# Patient Record
Sex: Female | Born: 1935 | Race: Black or African American | Hispanic: No | Marital: Married | Smoking: Never smoker
Health system: Southern US, Community
[De-identification: ages and names within clinical notes are randomized; demographics above are authoritative.]

## PROBLEM LIST (undated history)

## (undated) DIAGNOSIS — G309 Alzheimer's disease, unspecified: Secondary | ICD-10-CM

## (undated) DIAGNOSIS — F0280 Dementia in other diseases classified elsewhere without behavioral disturbance: Secondary | ICD-10-CM

## (undated) HISTORY — PX: HIP SURGERY: SHX245

---

## 2002-08-21 ENCOUNTER — Ambulatory Visit (HOSPITAL_COMMUNITY): Admission: RE | Admit: 2002-08-21 | Discharge: 2002-08-21 | Payer: Self-pay | Admitting: Family Medicine

## 2002-08-21 ENCOUNTER — Encounter: Payer: Self-pay | Admitting: Family Medicine

## 2003-08-23 ENCOUNTER — Encounter: Payer: Self-pay | Admitting: Family Medicine

## 2003-08-23 ENCOUNTER — Ambulatory Visit (HOSPITAL_COMMUNITY): Admission: RE | Admit: 2003-08-23 | Discharge: 2003-08-23 | Payer: Self-pay | Admitting: Family Medicine

## 2004-08-27 ENCOUNTER — Ambulatory Visit (HOSPITAL_COMMUNITY): Admission: RE | Admit: 2004-08-27 | Discharge: 2004-08-27 | Payer: Self-pay | Admitting: Family Medicine

## 2005-02-26 ENCOUNTER — Ambulatory Visit (HOSPITAL_COMMUNITY): Admission: RE | Admit: 2005-02-26 | Discharge: 2005-02-26 | Payer: Self-pay | Admitting: Family Medicine

## 2005-07-29 ENCOUNTER — Inpatient Hospital Stay (HOSPITAL_COMMUNITY): Admission: EM | Admit: 2005-07-29 | Discharge: 2005-08-02 | Payer: Self-pay | Admitting: Emergency Medicine

## 2005-08-09 ENCOUNTER — Encounter: Payer: Self-pay | Admitting: Internal Medicine

## 2005-09-14 ENCOUNTER — Ambulatory Visit: Admission: RE | Admit: 2005-09-14 | Discharge: 2005-09-14 | Payer: Self-pay | Admitting: Interventional Radiology

## 2005-11-19 ENCOUNTER — Ambulatory Visit (HOSPITAL_COMMUNITY): Admission: RE | Admit: 2005-11-19 | Discharge: 2005-11-19 | Payer: Self-pay | Admitting: Interventional Radiology

## 2006-06-17 ENCOUNTER — Ambulatory Visit (HOSPITAL_COMMUNITY): Admission: RE | Admit: 2006-06-17 | Discharge: 2006-06-17 | Payer: Self-pay | Admitting: Family Medicine

## 2007-09-03 ENCOUNTER — Emergency Department (HOSPITAL_COMMUNITY): Admission: EM | Admit: 2007-09-03 | Discharge: 2007-09-03 | Payer: Self-pay | Admitting: Emergency Medicine

## 2007-10-12 ENCOUNTER — Ambulatory Visit (HOSPITAL_COMMUNITY): Admission: RE | Admit: 2007-10-12 | Discharge: 2007-10-12 | Payer: Self-pay | Admitting: Neurology

## 2008-04-02 ENCOUNTER — Ambulatory Visit (HOSPITAL_COMMUNITY): Admission: RE | Admit: 2008-04-02 | Discharge: 2008-04-02 | Payer: Self-pay | Admitting: Family Medicine

## 2008-10-21 ENCOUNTER — Encounter (INDEPENDENT_AMBULATORY_CARE_PROVIDER_SITE_OTHER): Payer: Self-pay | Admitting: Internal Medicine

## 2008-10-21 ENCOUNTER — Ambulatory Visit: Payer: Self-pay | Admitting: *Deleted

## 2008-10-23 ENCOUNTER — Inpatient Hospital Stay (HOSPITAL_COMMUNITY): Admission: EM | Admit: 2008-10-23 | Discharge: 2008-10-31 | Payer: Self-pay | Admitting: Emergency Medicine

## 2008-11-27 ENCOUNTER — Ambulatory Visit: Payer: Self-pay | Admitting: Internal Medicine

## 2008-11-27 ENCOUNTER — Emergency Department (HOSPITAL_COMMUNITY): Admission: EM | Admit: 2008-11-27 | Discharge: 2008-11-27 | Payer: Self-pay | Admitting: Emergency Medicine

## 2008-11-27 ENCOUNTER — Encounter: Payer: Self-pay | Admitting: Urgent Care

## 2008-11-27 LAB — CONVERTED CEMR LAB
ALT: 27 units/L (ref 0–35)
AST: 32 units/L (ref 0–37)
BUN: 18 mg/dL (ref 6–23)
Bilirubin, Direct: 0.2 mg/dL (ref 0.0–0.3)
CO2: 29 meq/L (ref 19–32)
Chloride: 91 meq/L — ABNORMAL LOW (ref 96–112)
Indirect Bilirubin: 0.2 mg/dL (ref 0.0–0.9)
Potassium: 3.5 meq/L (ref 3.5–5.3)

## 2008-12-02 ENCOUNTER — Ambulatory Visit (HOSPITAL_COMMUNITY): Admission: RE | Admit: 2008-12-02 | Discharge: 2008-12-02 | Payer: Self-pay | Admitting: Internal Medicine

## 2008-12-12 ENCOUNTER — Ambulatory Visit: Payer: Self-pay | Admitting: Internal Medicine

## 2008-12-12 ENCOUNTER — Ambulatory Visit (HOSPITAL_COMMUNITY): Admission: RE | Admit: 2008-12-12 | Discharge: 2008-12-12 | Payer: Self-pay | Admitting: Internal Medicine

## 2008-12-17 ENCOUNTER — Encounter (HOSPITAL_COMMUNITY): Admission: RE | Admit: 2008-12-17 | Discharge: 2009-01-16 | Payer: Self-pay | Admitting: Internal Medicine

## 2008-12-23 ENCOUNTER — Telehealth (INDEPENDENT_AMBULATORY_CARE_PROVIDER_SITE_OTHER): Payer: Self-pay

## 2008-12-25 ENCOUNTER — Inpatient Hospital Stay (HOSPITAL_COMMUNITY): Admission: AD | Admit: 2008-12-25 | Discharge: 2008-12-28 | Payer: Self-pay | Admitting: Internal Medicine

## 2008-12-27 ENCOUNTER — Encounter (INDEPENDENT_AMBULATORY_CARE_PROVIDER_SITE_OTHER): Payer: Self-pay | Admitting: General Surgery

## 2009-01-27 DIAGNOSIS — R112 Nausea with vomiting, unspecified: Secondary | ICD-10-CM

## 2009-01-27 DIAGNOSIS — D539 Nutritional anemia, unspecified: Secondary | ICD-10-CM | POA: Insufficient documentation

## 2009-01-27 DIAGNOSIS — K5900 Constipation, unspecified: Secondary | ICD-10-CM | POA: Insufficient documentation

## 2009-01-27 DIAGNOSIS — Z9189 Other specified personal risk factors, not elsewhere classified: Secondary | ICD-10-CM | POA: Insufficient documentation

## 2009-01-27 DIAGNOSIS — F068 Other specified mental disorders due to known physiological condition: Secondary | ICD-10-CM

## 2009-01-27 DIAGNOSIS — R55 Syncope and collapse: Secondary | ICD-10-CM

## 2009-01-28 ENCOUNTER — Encounter: Payer: Self-pay | Admitting: Urgent Care

## 2009-02-12 ENCOUNTER — Telehealth (INDEPENDENT_AMBULATORY_CARE_PROVIDER_SITE_OTHER): Payer: Self-pay | Admitting: *Deleted

## 2009-04-18 ENCOUNTER — Ambulatory Visit: Payer: Self-pay | Admitting: Gastroenterology

## 2009-09-02 ENCOUNTER — Ambulatory Visit: Payer: Self-pay | Admitting: Cardiology

## 2009-09-02 ENCOUNTER — Inpatient Hospital Stay (HOSPITAL_COMMUNITY): Admission: EM | Admit: 2009-09-02 | Discharge: 2009-09-09 | Payer: Self-pay | Admitting: Emergency Medicine

## 2009-09-03 ENCOUNTER — Encounter (INDEPENDENT_AMBULATORY_CARE_PROVIDER_SITE_OTHER): Payer: Self-pay | Admitting: Orthopaedic Surgery

## 2009-10-20 ENCOUNTER — Emergency Department (HOSPITAL_COMMUNITY): Admission: EM | Admit: 2009-10-20 | Discharge: 2009-10-20 | Payer: Self-pay | Admitting: Emergency Medicine

## 2010-07-16 ENCOUNTER — Ambulatory Visit (HOSPITAL_COMMUNITY): Admission: RE | Admit: 2010-07-16 | Discharge: 2010-07-16 | Payer: Self-pay | Admitting: Ophthalmology

## 2010-11-05 IMAGING — CR DG HIP COMPLETE 2+V*R*
3 series · 3 of 3 positions shown · non-contrast
Comparison: None

CLINICAL DATA: Fall.

RIGHT HIP - COMPLETE 2+ VIEW

[view not recorded (1 of 3)]
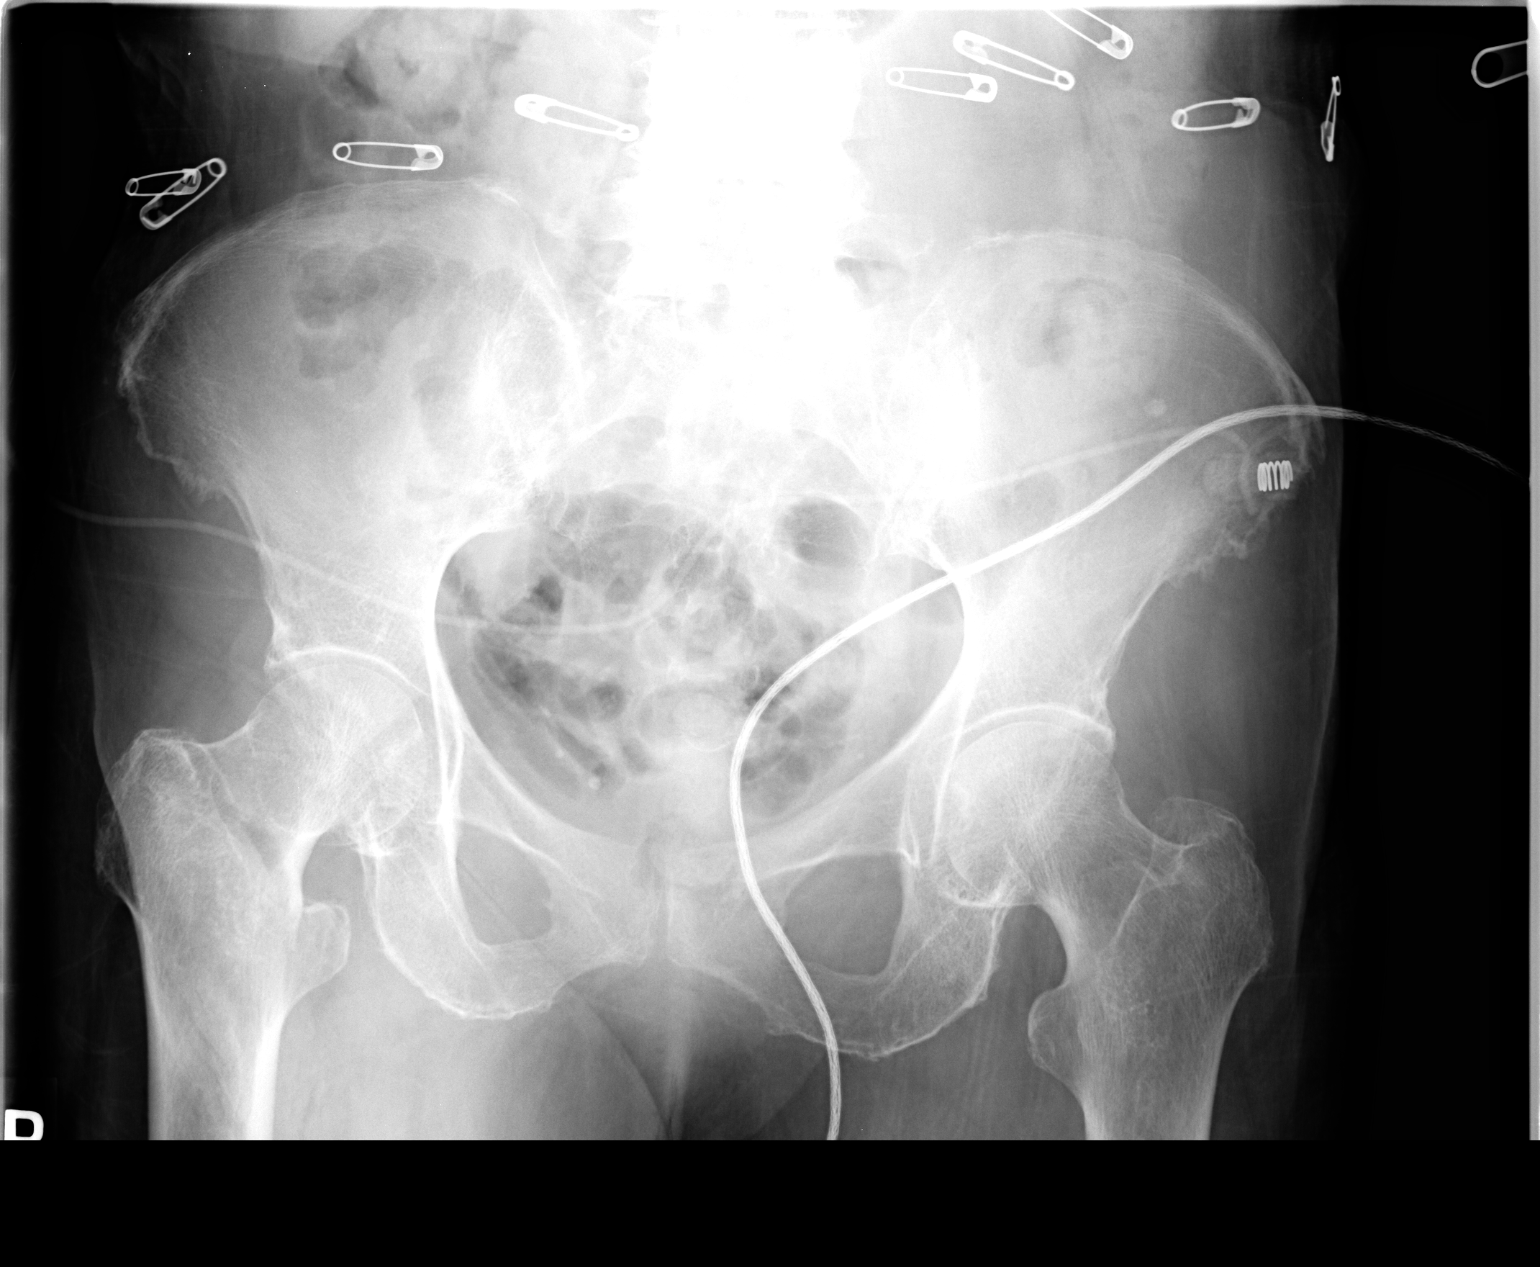

[view not recorded (2 of 3)]
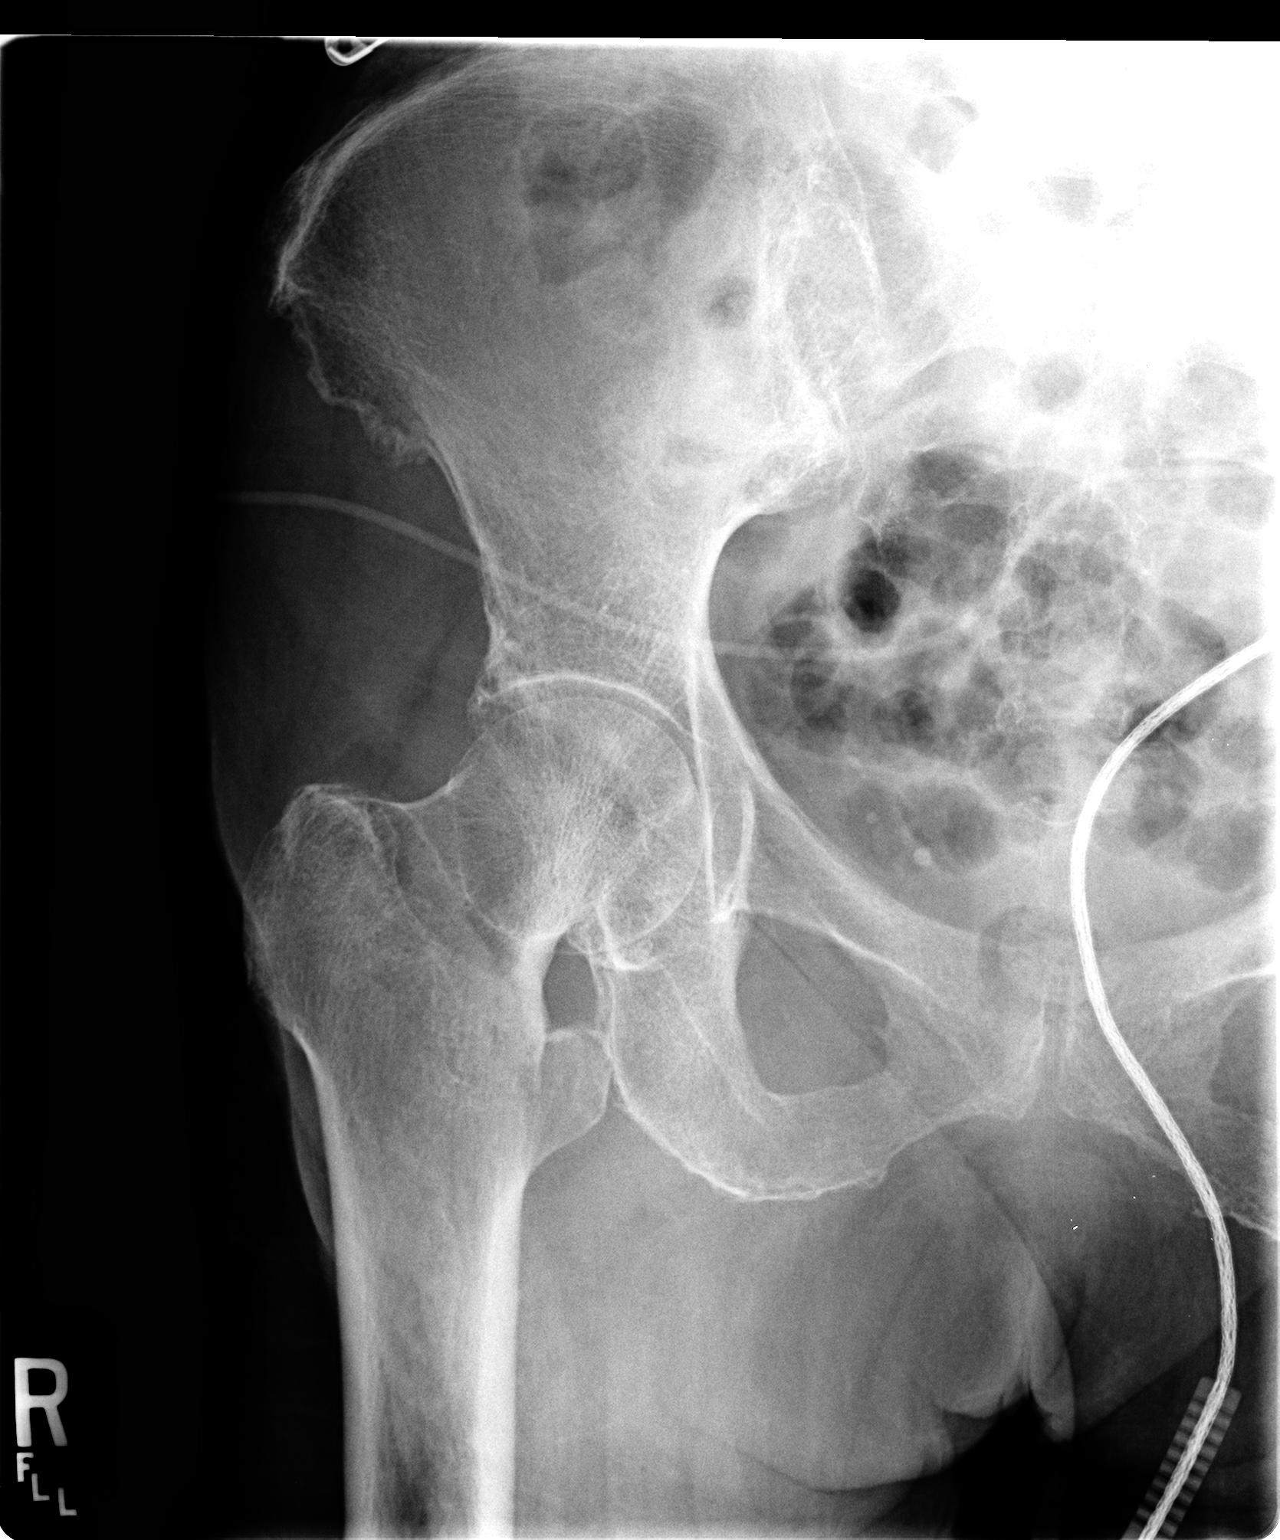

[view not recorded (3 of 3)]
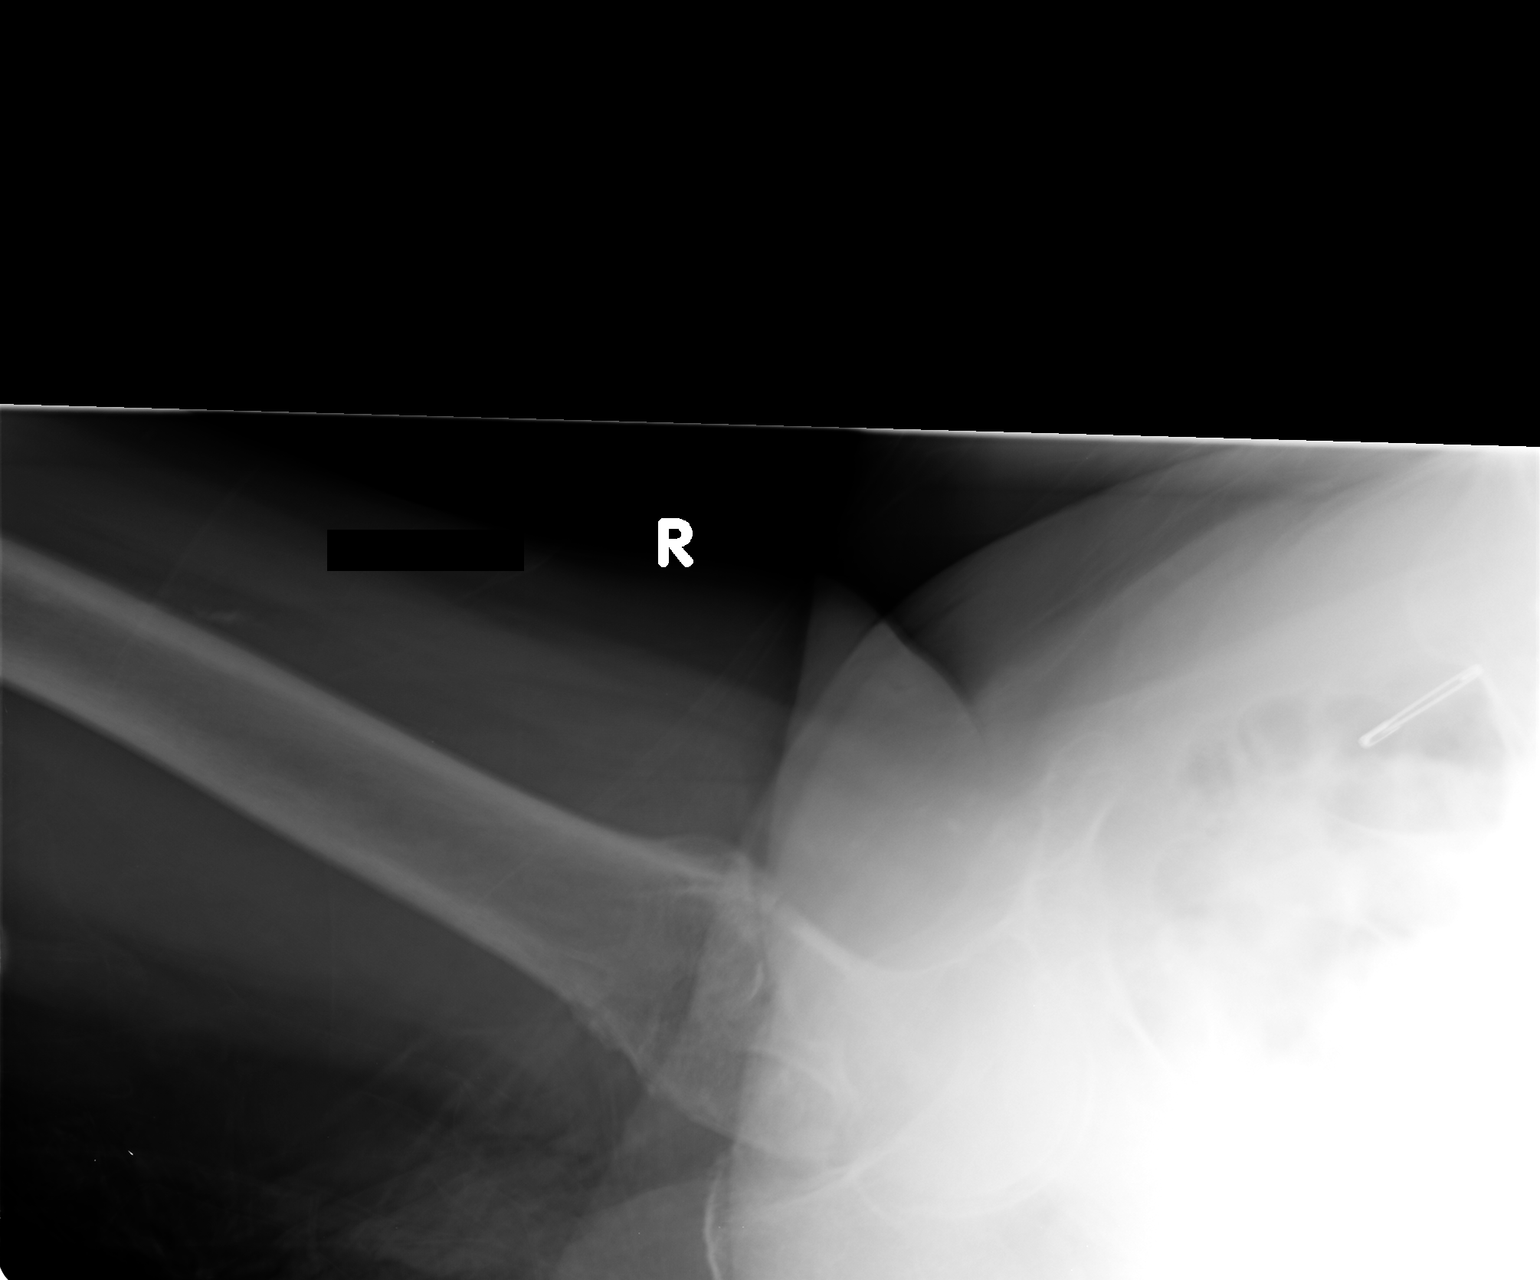

[3 of 3 positions shown; findings below may reference images not displayed]

FINDINGS: No definite acute fracture or dislocation.  Unremarkable
soft tissues.
IMPRESSION: No definable fracture or dislocation.  As clinically indicated, CT
or MRI can be performed to rule out occult fracture.

## 2010-11-07 IMAGING — RF DG HIP OPERATIVE*R*
1 series · 5 of 5 positions shown · non-contrast
Comparison: CT 09/02/2009

CLINICAL DATA: Right femoral intertrochanteric fracture.

OPERATIVE RIGHT HIP

[Series 1: run · 5 of 5 slices shown]
[im 1/5]
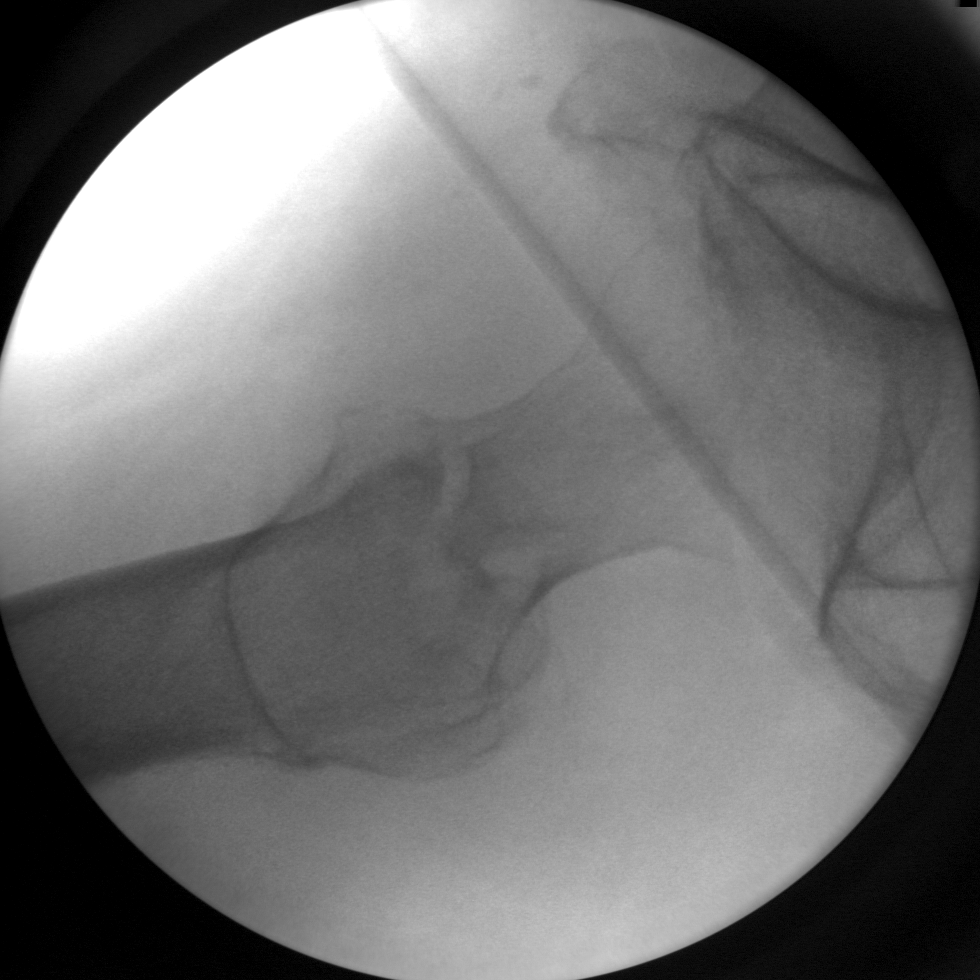
[im 2/5]
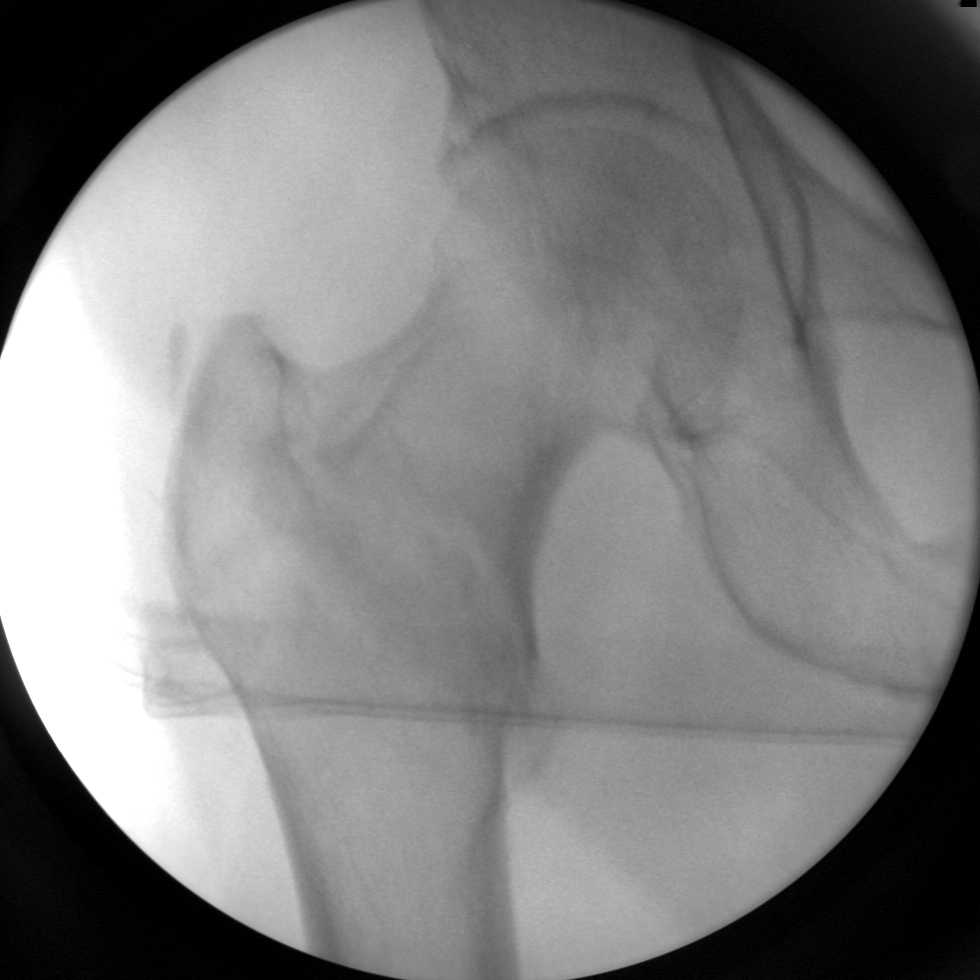
[im 3/5]
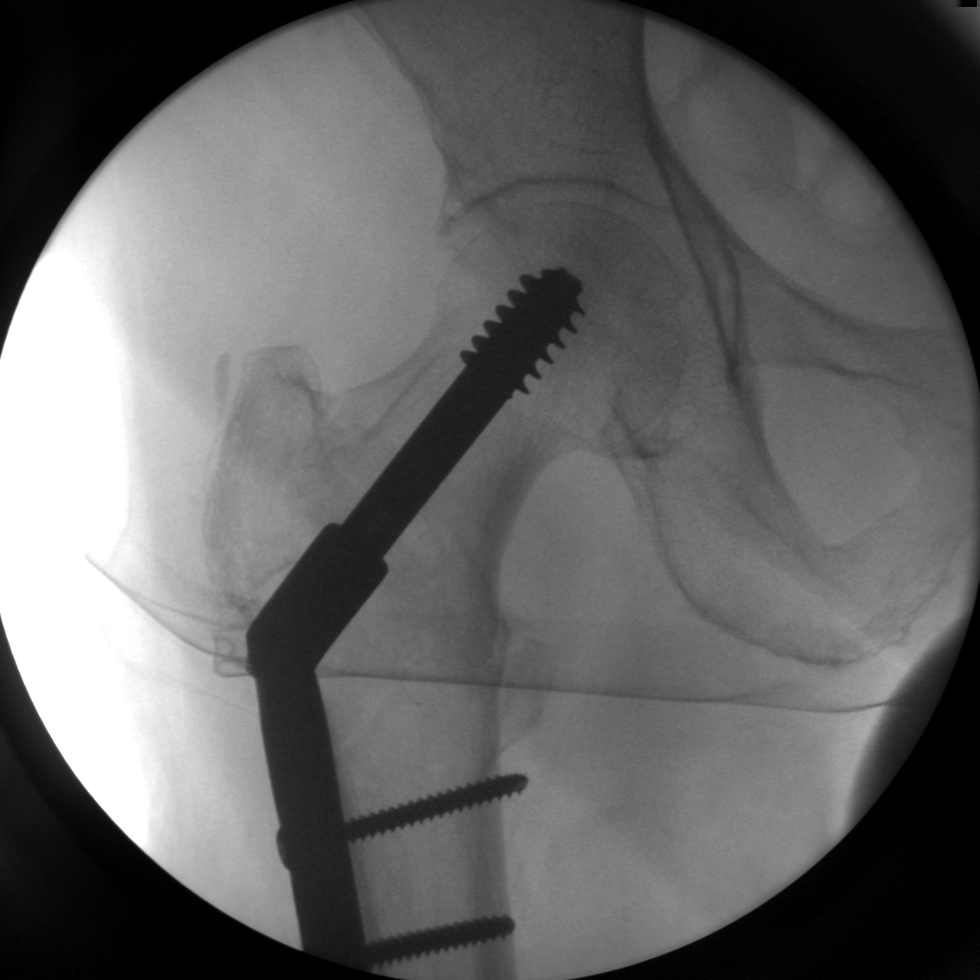
[im 4/5]
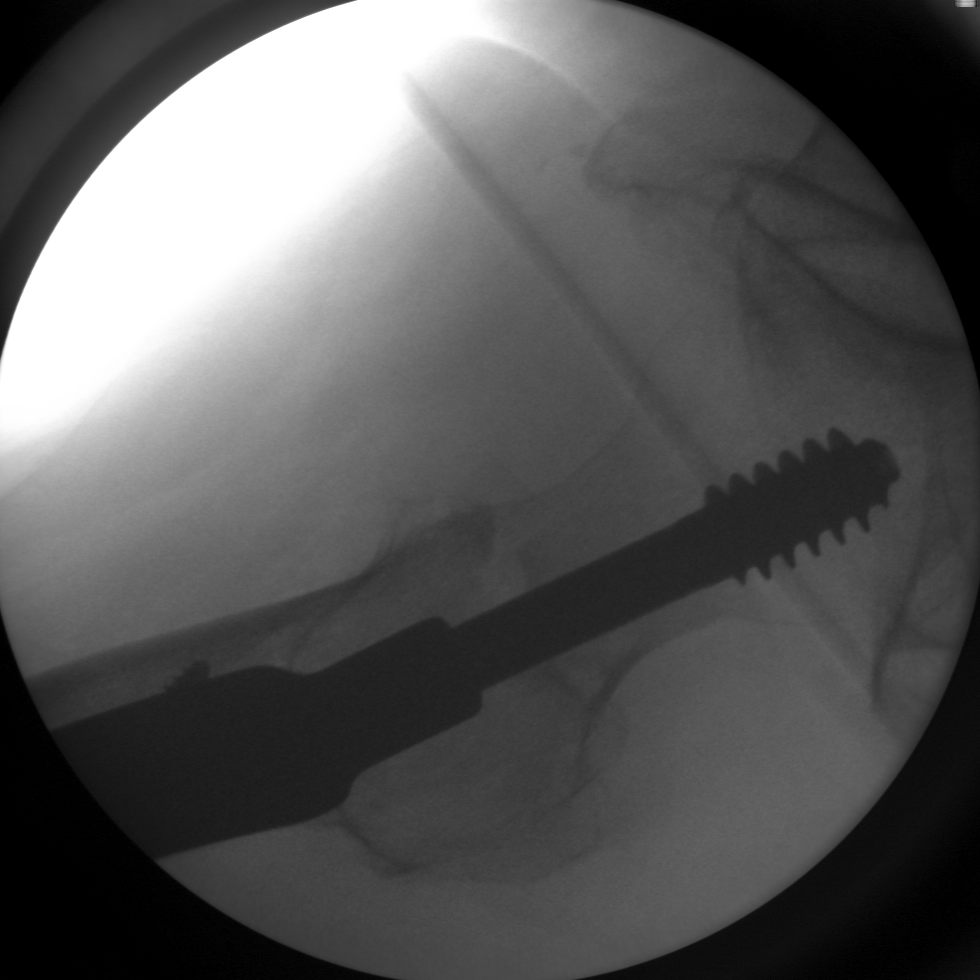
[im 5/5]
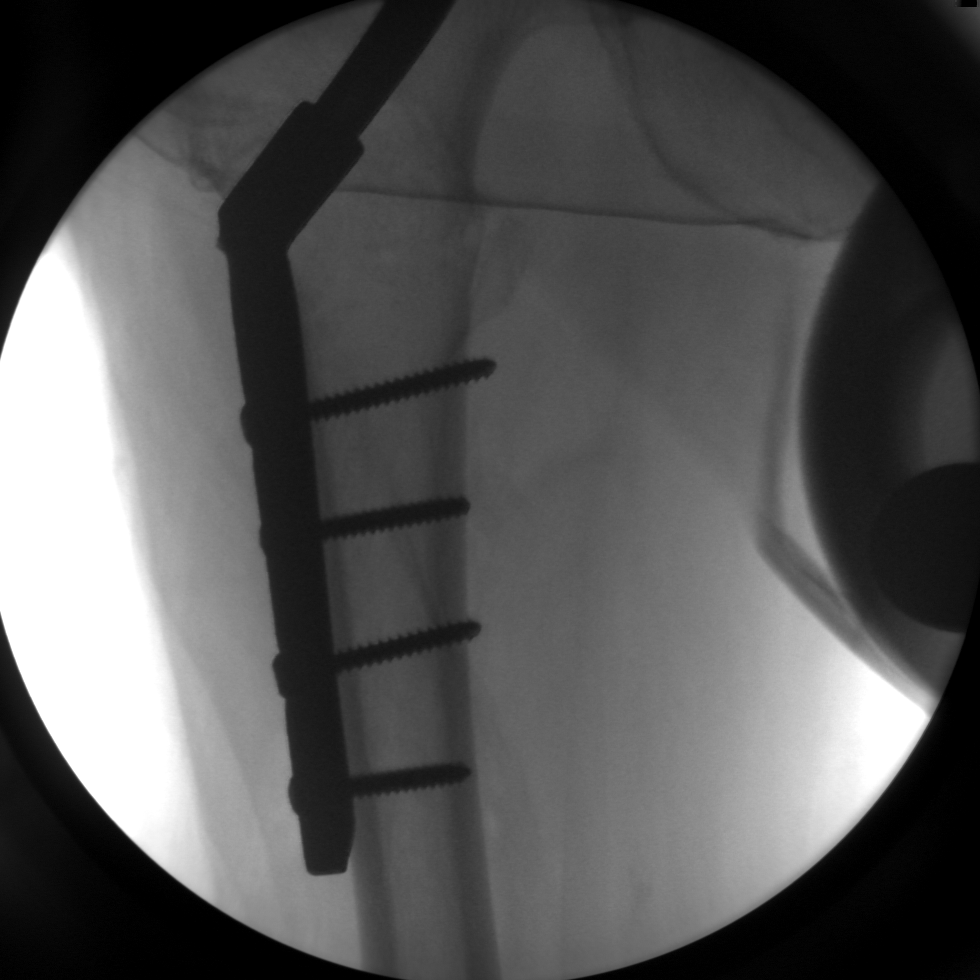

[5 of 5 positions shown; findings below may reference images not displayed]

FINDINGS: Compression screw and plate fixation of the proximal
femur.  The dynamic screw extends up through the femoral neck and
head.  The tip of the dynamic screw is subarticular in position.
Four screws traverse the plate and proximal femoral shaft.
IMPRESSION: Status post ORIF of right femoral intertrochanteric fracture.
Satisfactory operative findings.

## 2011-01-14 LAB — BASIC METABOLIC PANEL
BUN: 16 mg/dL (ref 6–23)
Chloride: 102 mEq/L (ref 96–112)
Glucose, Bld: 105 mg/dL — ABNORMAL HIGH (ref 70–99)
Potassium: 3.8 mEq/L (ref 3.5–5.1)

## 2011-02-03 LAB — DIFFERENTIAL
Basophils Absolute: 0 10*3/uL (ref 0.0–0.1)
Basophils Absolute: 0 10*3/uL (ref 0.0–0.1)
Basophils Relative: 0 % (ref 0–1)
Eosinophils Absolute: 0 10*3/uL (ref 0.0–0.7)
Eosinophils Absolute: 0.1 10*3/uL (ref 0.0–0.7)
Eosinophils Absolute: 0.3 10*3/uL (ref 0.0–0.7)
Eosinophils Relative: 0 % (ref 0–5)
Eosinophils Relative: 5 % (ref 0–5)
Lymphocytes Relative: 10 % — ABNORMAL LOW (ref 12–46)
Lymphocytes Relative: 13 % (ref 12–46)
Lymphocytes Relative: 7 % — ABNORMAL LOW (ref 12–46)
Lymphs Abs: 0.3 10*3/uL — ABNORMAL LOW (ref 0.7–4.0)
Lymphs Abs: 0.5 10*3/uL — ABNORMAL LOW (ref 0.7–4.0)
Lymphs Abs: 0.6 10*3/uL — ABNORMAL LOW (ref 0.7–4.0)
Lymphs Abs: 1 10*3/uL (ref 0.7–4.0)
Lymphs Abs: 1.5 10*3/uL (ref 0.7–4.0)
Monocytes Absolute: 0.5 10*3/uL (ref 0.1–1.0)
Monocytes Absolute: 0.6 10*3/uL (ref 0.1–1.0)
Monocytes Absolute: 0.8 10*3/uL (ref 0.1–1.0)
Monocytes Absolute: 0.8 10*3/uL (ref 0.1–1.0)
Monocytes Relative: 7 % (ref 3–12)
Monocytes Relative: 9 % (ref 3–12)
Monocytes Relative: 9 % (ref 3–12)
Neutro Abs: 5.7 10*3/uL (ref 1.7–7.7)
Neutro Abs: 6.1 10*3/uL (ref 1.7–7.7)
Neutro Abs: 7.2 10*3/uL (ref 1.7–7.7)
Neutro Abs: 7.3 10*3/uL (ref 1.7–7.7)
Neutrophils Relative %: 77 % (ref 43–77)
Neutrophils Relative %: 84 % — ABNORMAL HIGH (ref 43–77)

## 2011-02-03 LAB — BASIC METABOLIC PANEL
BUN: 10 mg/dL (ref 6–23)
BUN: 13 mg/dL (ref 6–23)
BUN: 14 mg/dL (ref 6–23)
BUN: 16 mg/dL (ref 6–23)
BUN: 6 mg/dL (ref 6–23)
BUN: 8 mg/dL (ref 6–23)
Calcium: 8.4 mg/dL (ref 8.4–10.5)
Calcium: 8.5 mg/dL (ref 8.4–10.5)
Calcium: 8.6 mg/dL (ref 8.4–10.5)
Calcium: 8.6 mg/dL (ref 8.4–10.5)
Calcium: 8.9 mg/dL (ref 8.4–10.5)
Chloride: 100 mEq/L (ref 96–112)
Chloride: 100 mEq/L (ref 96–112)
Chloride: 102 mEq/L (ref 96–112)
Chloride: 103 mEq/L (ref 96–112)
Chloride: 94 mEq/L — ABNORMAL LOW (ref 96–112)
Creatinine, Ser: 0.95 mg/dL (ref 0.4–1.2)
Creatinine, Ser: 1.07 mg/dL (ref 0.4–1.2)
GFR calc Af Amer: 58 mL/min — ABNORMAL LOW (ref >60)
GFR calc Af Amer: >60 mL/min (ref >60)
GFR calc Af Amer: >60 mL/min (ref >60)
GFR calc non Af Amer: 42 mL/min — ABNORMAL LOW (ref >60)
GFR calc non Af Amer: 48 mL/min — ABNORMAL LOW (ref >60)
GFR calc non Af Amer: 55 mL/min — ABNORMAL LOW (ref >60)
GFR calc non Af Amer: >60 mL/min (ref >60)
Glucose, Bld: 101 mg/dL — ABNORMAL HIGH (ref 70–99)
Glucose, Bld: 111 mg/dL — ABNORMAL HIGH (ref 70–99)
Glucose, Bld: 116 mg/dL — ABNORMAL HIGH (ref 70–99)
Potassium: 3.3 mEq/L — ABNORMAL LOW (ref 3.5–5.1)
Potassium: 3.4 mEq/L — ABNORMAL LOW (ref 3.5–5.1)
Potassium: 3.9 mEq/L (ref 3.5–5.1)
Potassium: 4.2 mEq/L (ref 3.5–5.1)
Sodium: 132 mEq/L — ABNORMAL LOW (ref 135–145)
Sodium: 133 mEq/L — ABNORMAL LOW (ref 135–145)

## 2011-02-03 LAB — CBC
HCT: 34.2 % — ABNORMAL LOW (ref 36.0–46.0)
HCT: 34.8 % — ABNORMAL LOW (ref 36.0–46.0)
Hemoglobin: 10.1 g/dL — ABNORMAL LOW (ref 12.0–15.0)
Hemoglobin: 11.2 g/dL — ABNORMAL LOW (ref 12.0–15.0)
Hemoglobin: 11.4 g/dL — ABNORMAL LOW (ref 12.0–15.0)
MCV: 71 fL — ABNORMAL LOW (ref 78.0–100.0)
MCV: 71.2 fL — ABNORMAL LOW (ref 78.0–100.0)
MCV: 74.1 fL — ABNORMAL LOW (ref 78.0–100.0)
Platelets: 132 10*3/uL — ABNORMAL LOW (ref 150–400)
Platelets: 172 10*3/uL (ref 150–400)
Platelets: 184 10*3/uL (ref 150–400)
RBC: 4.19 MIL/uL (ref 3.87–5.11)
RBC: 4.58 MIL/uL (ref 3.87–5.11)
RBC: 4.69 MIL/uL (ref 3.87–5.11)
RDW: 15.4 % (ref 11.5–15.5)
RDW: 16.3 % — ABNORMAL HIGH (ref 11.5–15.5)
WBC: 5.9 10*3/uL (ref 4.0–10.5)
WBC: 7.4 10*3/uL (ref 4.0–10.5)
WBC: 8 10*3/uL (ref 4.0–10.5)
WBC: 8.7 10*3/uL (ref 4.0–10.5)

## 2011-02-03 LAB — URINALYSIS, ROUTINE W REFLEX MICROSCOPIC
Glucose, UA: NEGATIVE mg/dL
Specific Gravity, Urine: 1.01 (ref 1.005–1.030)
pH: 7 (ref 5.0–8.0)

## 2011-02-03 LAB — TYPE AND SCREEN

## 2011-02-03 LAB — POCT CARDIAC MARKERS
CKMB, poc: <1 ng/mL — ABNORMAL LOW (ref 1.0–8.0)
Myoglobin, poc: 182 ng/mL (ref 12–200)
Troponin i, poc: <0.05 ng/mL (ref 0.00–0.09)

## 2011-02-03 LAB — URINE CULTURE

## 2011-02-03 LAB — CARDIAC PANEL(CRET KIN+CKTOT+MB+TROPI)
CK, MB: 2.6 ng/mL (ref 0.3–4.0)
CK, MB: 3 ng/mL (ref 0.3–4.0)
Relative Index: 1 (ref 0.0–2.5)
Relative Index: 1.3 (ref 0.0–2.5)
Relative Index: 1.4 (ref 0.0–2.5)
Total CK: 293 U/L — ABNORMAL HIGH (ref 7–177)
Troponin I: 0.02 ng/mL (ref 0.00–0.06)
Troponin I: 0.02 ng/mL (ref 0.00–0.06)
Troponin I: 0.03 ng/mL (ref 0.00–0.06)

## 2011-02-03 LAB — GLUCOSE, CAPILLARY: Glucose-Capillary: 132 mg/dL — ABNORMAL HIGH (ref 70–99)

## 2011-02-03 LAB — URINE MICROSCOPIC-ADD ON

## 2011-02-03 LAB — MAGNESIUM
Magnesium: 2 mg/dL (ref 1.5–2.5)
Magnesium: 2.2 mg/dL (ref 1.5–2.5)

## 2011-02-03 LAB — PROTIME-INR: Prothrombin Time: 13.1 seconds (ref 11.6–15.2)

## 2011-02-03 LAB — ABO/RH: ABO/RH(D): O POS

## 2011-02-15 LAB — URINALYSIS, ROUTINE W REFLEX MICROSCOPIC
Bilirubin Urine: NEGATIVE
Hgb urine dipstick: NEGATIVE
Nitrite: NEGATIVE
Specific Gravity, Urine: 1.01 (ref 1.005–1.030)
pH: 6.5 (ref 5.0–8.0)

## 2011-02-16 LAB — BASIC METABOLIC PANEL
CO2: 28 mEq/L (ref 19–32)
CO2: 34 mEq/L — ABNORMAL HIGH (ref 19–32)
Calcium: 8.2 mg/dL — ABNORMAL LOW (ref 8.4–10.5)
Chloride: 102 mEq/L (ref 96–112)
GFR calc Af Amer: >60 mL/min (ref >60)
GFR calc Af Amer: >60 mL/min (ref >60)
GFR calc non Af Amer: 52 mL/min — ABNORMAL LOW (ref >60)
Glucose, Bld: 97 mg/dL (ref 70–99)
Glucose, Bld: 98 mg/dL (ref 70–99)
Potassium: 3.1 mEq/L — ABNORMAL LOW (ref 3.5–5.1)
Potassium: 4.1 mEq/L (ref 3.5–5.1)
Sodium: 133 mEq/L — ABNORMAL LOW (ref 135–145)
Sodium: 140 mEq/L (ref 135–145)

## 2011-02-16 LAB — CBC
HCT: 30.2 % — ABNORMAL LOW (ref 36.0–46.0)
Hemoglobin: 9.7 g/dL — ABNORMAL LOW (ref 12.0–15.0)
RBC: 4.29 MIL/uL (ref 3.87–5.11)
RDW: 16 % — ABNORMAL HIGH (ref 11.5–15.5)

## 2011-02-16 LAB — DIFFERENTIAL
Basophils Absolute: 0 10*3/uL (ref 0.0–0.1)
Eosinophils Relative: 2 % (ref 0–5)
Lymphocytes Relative: 16 % (ref 12–46)
Monocytes Absolute: 0.5 10*3/uL (ref 0.1–1.0)
Monocytes Relative: 8 % (ref 3–12)
Neutro Abs: 4.3 10*3/uL (ref 1.7–7.7)

## 2011-03-16 NOTE — Op Note (Signed)
NAME:  [name], [name]                 ACCOUNT NO.:  [id number]   MEDICAL RECORD NO.:  [id number]          PATIENT TYPE:  AMB   LOCATION:  DAY                           FACILITY:  [facility]   PHYSICIAN:  R. Roetta Sessions, M.D. DATE OF BIRTH:  Nov 23, 1916   DATE OF PROCEDURE:  12/12/2008  DATE OF DISCHARGE:  12/12/2008                               OPERATIVE REPORT   INDICATIONS FOR PROCEDURE:  This is a 77 year old African American  female with chronic nausea, vomiting, history constipation.  EGD now  being done to further evaluate her symptoms.  Risks, benefits and  limitations have been reviewed, all parties' questions answered.  Odette Horns and [employer], the patient's daughter and power of  attorney, respectively have been counseled on this procedure.  All  parties agreeable.   PROCEDURE NOTE:  O2 saturation, blood pressure, pulse and respirations  were monitored the entire procedure.   CONSCIOUS SEDATION:  Versed 2 mg IV, Demerol 25 mg IV in a single dose.   INSTRUMENT:  Pentax video chip system.   Cetacaine spray topical pharyngeal anesthesia.   FINDINGS:  Esophagogastroduodenoscopy examination of the tubular  esophagus revealed no mucosal abnormalities.  EG junction easily  traversed.  Stomach:  Gas cavity was empty.  It insufflated well with air.  Thorough  examination of gastric mucosa including retroflexion in the proximal  stomach, esophagogastric junction demonstrated only a small hiatal  hernia.  Gastric mucosa appeared entirely normal.  Pylorus patent,  easily traversed.  Examination of the bulb and second portion revealed  no abnormalities.   THERAPEUTIC/DIAGNOSTIC MANEUVERS PERFORMED:  None.   The patient tolerated the procedure, was reacted in endoscopy.   IMPRESSION:  Normal esophagus, small hiatal hernia, otherwise normal  stomach, D1-D2.   RECOMMENDATIONS:  Prior gallbladder ultrasound demonstrated sludge only  in the gallbladder.  Today's findings  would not explain her nausea and  vomiting.   RECOMMENDATIONS:  Proceed with HIDA with fatty meal challenge.  Further  recommendations to follow.      Jonathon Bellows, M.D.  Electronically Signed     RMR/MEDQ  D:  12/24/2008  T:  12/24/2008  Job:  [id number]   cc:   Tesfaye D. Felecia Shelling, MD  Fax: [phone]

## 2011-03-16 NOTE — Group Therapy Note (Signed)
[name], [name]                 ACCOUNT NO.:  [id number]   MEDICAL RECORD NO.:  [id number]          PATIENT TYPE:  INP   LOCATION:  5005                         FACILITY:  [facility]   PHYSICIAN:  Richarda Overlie, MD       DATE OF BIRTH:  1917/10/31                                 PROGRESS NOTE   [name] had a BM last night.  She denied any nausea or vomiting,  abdominal pain.  Vital signs overnight were stable.  Kindly see  discharge summaries from the 21st, 28th and 29th for details of her  hospitalization, discharge medications and plan for followup remain  unchanged .      Richarda Overlie, MD  Electronically Signed     NA/MEDQ  D:  10/31/2008  T:  10/31/2008  Job:  [id number]

## 2011-03-16 NOTE — Discharge Summary (Signed)
NAMEEYLA, TALLON                 ACCOUNT NO.:  [id number]   MEDICAL RECORD NO.:  [id number]          PATIENT TYPE:  OBV   LOCATION:  4705                         FACILITY:  [facility]   PHYSICIAN:  Richarda Overlie, MD       DATE OF BIRTH:  Mar 31, 1917   DATE OF ADMISSION:  10/17/2008  DATE OF DISCHARGE:  10/21/2008                               DISCHARGE SUMMARY   DISCHARGE DIAGNOSES:  1. Syncope probably related to orthostatic hypotension secondary to      dehydration and urinary tract infection.  2. Possible small nonhemorrhagic infarct in the right lenticular      nucleus.  3. Marked small vessel disease.  4. No significant intracranial atherosclerotic disease.  5. Dementia.  6. Urinary tract infection.   RADIOLOGIC STUDIES:  1. MRI/MRA of the head with and without contrast shows acute small      nonhemorrhagic infarct on the right lenticular nucleus in to the      right mid-corona radiata, atrophy, marked small vessel disease.  No      evidence of hemodynamically significant stenosis of the carotid      distribution.   1. CT of the head without contrast was negative, shows atrophy and      microvascular white matter disease, possible chronic sinusitis.      Carotid duplex ultrasound pending.   1. Results of cardiac echo pending.   1. Repeat carotid duplax USG pending   SUBJECTIVE:  This is an 77 year old female who was brought to the ER for  further evaluation after a syncopal episode.  The syncope was  unwitnessed.  There was no postictal state described.  The patient was  alert, communicative at the time the daughter found her on the floor.  There was no evidence of urinary or stool incontinence, tongue-biting or  foaming of the mouth was noted.  The patient does have a history of  vertebrobasilar insufficiency and history of recurrent syncope.  The  patient had positive orthostatics when examined in the ER.  EKG showed  normal sinus rhythm without any obvious ST-T segment  changes.  She does  have a history of a 50% stenosis of the distal left vertebral basilar  junction, mild stenosis of the right vertebral basilar junction, 30%  stenosis of the proximal right vertebral artery and therefore the  patient was admitted for evaluation of TIA/stroke.  At that point when  her stenosis was discovered in 2006 medical workup was recommended.  The  patient's initial CT scan without contrast was negative for CVA.  MRI  was positive as described above.  Because of no flow limiting disease  seen in her carotid and vertebral basilar distribution repeat carotid  duplex ultrasound was ordered which is pending. For the possiblity of  CVA the patient was continued on her Plavix.  Workup for this was done  as mentioned above.  She also had a lipid panel that showed an elevated  LDL of 118.  The patient was started on Lipitor prior to discharge.  She  would need a repeat CK, lipid panel, liver  function testing in 4-6  weeks.  The patient was found to have orthostatic hypotension and was  hydrated aggressively.  Her orthostatic hypotension was thought to be  secondary to dehydration secondary to UTI.  The patient's urine culture  showed 100,000 colonies with multiple bacterial morphotypes present and  therefore the patient was empirically treated with ciprofloxacin which  she should continue for another 3 days.   Physical therapy/occupational therapy evaluated the patient and home  health physical therapy and home PT will be set up as recommended.    Anemia workup was initiated, however her workup was consistent with  anemia of chronic disease.  Her iron level was normal, TIBC was normal,  percentage saturation was 15, vitamin B12 level was found to be 1040,  folate was found to be greater than 20, ferritin was 220.   FOLLOWUP CONCERNS:  Follow up with Dr. [name] on the 28th of this  month at 11:45.   DISCHARGE MEDICATIONS:  1. Ciprofloxacin 500 mg p.o. q. 12 for  three days.  2. K-Dur 40 mEq p.o. daily for seven days.  3. Lipitor 20 mg p.o. daily.  4. Plavix 75 mg p.o. daily.  5. Aricept 10 mg p.o. daily.  6. Calcium 600 with vitamin D twice daily.  7. Namenda 5 mg twice a day.  8. Hydrochlorothiazide 25 mg p.o. daily.  9. Magnesium oxide 400 mg p.o. daily.   DISPOSITION:  Discharged today after final PT/OT recommendations.   Addendum discharge was held because of right foot pain after patient was  ambulated , Plain right foot/ankle radiographs showed possibility of  nondisplaced lateral Mallelor fracture . Othopedics was consulted and  there recommendations are still pending at this time      Richarda Overlie, MD  Electronically Signed     NA/MEDQ  D:  10/21/2008  T:  10/21/2008  Job:  [id number]   cc:   [name], M.D.

## 2011-03-16 NOTE — Consult Note (Signed)
NAME:  [name], [name]                 ACCOUNT NO.:  [id number]   MEDICAL RECORD NO.:  [id number]          PATIENT TYPE:  AMB   LOCATION:  DAY                           FACILITY:  [facility]   PHYSICIAN:  R. Roetta Sessions, M.D. DATE OF BIRTH:  1917/08/18   DATE OF CONSULTATION:  11/27/2008  DATE OF DISCHARGE:                                 CONSULTATION   REQUESTING PHYSICIAN:  Tesfaye D. Felecia Shelling, MD.   REASON FOR CONSULTATION:  Nausea and vomiting for 3 weeks.   HISTORY OF PRESENT ILLNESS:  Ms. Vandervort is an 77 year old African  American female.  She is a resident of [facility].  She has had  nausea and vomiting for 3 weeks.  She does have history of dementia and  is a very poor historian, I am unable to get much history.  She tells me  I think I might have ate something bad.  She is actively vomiting upon  examination today and this is made worse with movement.  I did  eventually get in touch with her nurse at [facility], who noted that  she has had nausea and vomiting every day.  No particular time of day,  but throughout the day most days of the week for the last 3 weeks.  There are no complaints of abdominal pain.  She has had some  constipation.  She did have to give an enema and she did get good  results with this.  She has done better since she has been on MiraLax 17  gm daily.  She denies any fever or chills.  She tells me she is eating  less than 50% of her meals.  She has obviously had some anorexia.  She  has been visited recently by her daughter and her husband.  I did call  to talk to her husband who seemed somewhat confused as well.  I did talk  with her daughter, Hartley Barefoot at [phone] who told me that  Weldon Inches [phone]) is her power of attorney.   I was informed that when Ms. Willison was having her blood drawn, she  became short of breath.  She was then transported to the emergency room  for further evaluation and management which is very  appropriate.   PAST MEDICAL/SURGICAL HISTORY:  1. Dementia.  2. Chronic anemia.  3. Constipation.  4. Syncopal episodes.  5. Ankle fracture.  6. Vertebral artery stenosis.   CURRENT MEDICATIONS:  1. Lipitor 20 mg daily.  2. Aricept 10 mg daily.  3. Hydrochlorothiazide 12.5 mg daily.  4. Magnesium 400 mg daily.  5. Senna 2 tablets b.i.d.  6. Ultram 100 mg b.i.d.  7. Aggrenox 25/200 mg b.i.d.  8. Omeprazole 20 mg daily.  9. Colace 200 mg b.i.d.  10.Calcium 600 mg b.i.d.  11.Namenda 5 mg b.i.d.  12.MiraLax 17 gm daily.  13.Percocet 5/325 mg p.r.n.  14.Phenergan 12.5 mg p.r.n.  15.Milk of Magnesia p.r.n.  16.Tylenol p.r.n.  17.Zofran 4 mg p.r.n.   ALLERGIES:  NO KNOWN DRUG ALLERGIES.   FAMILY HISTORY:  Unable to obtain.  SOCIAL HISTORY:  Ms. Rebuck is married.  She has 2 healthy children.  She is a resident of [facility].  She is retired.  She denies any tobacco,  alcohol or drug use.   REVIEW OF SYSTEMS:  See HPI, otherwise unable to obtain.   PHYSICAL EXAMINATION:  VITAL SIGNS:  Weight cannot be obtained.  The  patient is wheelchair-bound, temperature 98 degrees, blood pressure  120/84, pulse 84.  GENERAL:  She is a thin [place] American female who is awake, alert,  disoriented in no acute distress.  However, she does appear ill.  She  does have nausea and vomiting throughout the exam.  HEENT:  Sclerae clear, nonicteric.  Conjunctivae pink.  Oropharynx pink  and moist.  CHEST/HEART:  Regular rate and rhythm.  Normal S1-S2 without  any murmurs, clicks, rubs or gallops.  LUNGS:  Clear to auscultation bilaterally.  ABDOMEN:  Flat with positive bowel sounds x4.  No bruits auscultated.  Soft, nontender, nondistended without palpable mass or  hepatosplenomegaly.  No rebound or guarding.  EXTREMITIES:  Without clubbing or edema.   LABORATORY DATA:  Laboratory studies from today show white blood cell  count of 5.9, hemoglobin 13.2, hematocrit 41.1, MCV 70.8, platelets 286,   granulocyte percent 83, sodium 135, potassium 3.5, chloride 91, CO2 29,  BUN 18, creatinine 1.59, calcium 9.8 and glucose 127.  LFTs are normal  except for alkaline phosphatase of 128, amylase 151 and lipase 18.   DIAGNOSTICS:  She has acute abdominal series pending.   IMPRESSION:  Ms. Scotto is an 77 year old African female with chronic  nausea, vomiting and history of constipation.  I suspect she could have  gastric outlet obstruction, ileus, occult malignancy, urinary tract  infection/urosepsis, neurologic process, or gastroparesis as  etiology  of her chronic nausea and vomiting.  Differential is quite wide at this  point.  She does have dementia.  She is a poor historian.  I was unable  to get very little limited information from her family and her  caregivers.   PLAN:  1. Clear liquids to include Gatorade, ginger ale, soup, broth, etc.  2. STAT urinalysis with culture if positive, CBC.  3. STAT acute abdominal series today.  4. To the ER if with severe pain, syncope or weakness.   I would like to thank you, Dr. Felecia Shelling for allowing [place] to participate in  the care of Ms. Bunte.      Lorenza Burton, N.P.      Jonathon Bellows, M.D.  Electronically Signed    KJ/MEDQ  D:  11/27/2008  T:  11/27/2008  Job:  [id number]   cc:   Tesfaye D. Felecia Shelling, MD  Fax: [phone]

## 2011-03-16 NOTE — Discharge Summary (Signed)
[name], [name]                 ACCOUNT NO.:  [id number]   MEDICAL RECORD NO.:  [id number]          PATIENT TYPE:  INP   LOCATION:  5005                         FACILITY:  [facility]   PHYSICIAN:  Richarda Overlie, MD       DATE OF BIRTH:  [birth year]   DATE OF ADMISSION:  10/17/2008  DATE OF DISCHARGE:                               DISCHARGE SUMMARY   ADDENDUM:  Ms. [name] continues to be medically stable for  discharge at this time to SNF pending approval by the family. Social  worker notified.   FOLLOW-UP PLAN:  Appointment with Dr. Jerl Santos on November 06, 2008, at 2  p.m. Contact number for Dr. Nolon Nations office is [phone]. Insurance  information and demographics faxed to [phone].   Follow up with [name] in [place] in 5-7 days.   Home health care set up following discharge from SNF, if indicated at  that point.      Richarda Overlie, MD  Electronically Signed     NA/MEDQ  D:  10/29/2008  T:  10/29/2008  Job:  [id number]

## 2011-03-16 NOTE — Discharge Summary (Signed)
NAMEZOIEE, WIMMER                 ACCOUNT NO.:  [id number]   MEDICAL RECORD NO.:  [id number]          PATIENT TYPE:  INP   LOCATION:  A301                          FACILITY:  [facility]   PHYSICIAN:  Dalia Heading, M.D.  DATE OF BIRTH:  [birth year]   DATE OF ADMISSION:  12/25/2008  DATE OF DISCHARGE:  02/27/2010LH                               DISCHARGE SUMMARY   HOSPITAL COURSE SUMMARY:  The patient is an 77 year old black female  with a history of dementia and anemia of chronic disease, who was  admitted directly from the nursing home by Dr. Avon Gully for  worsening nausea and vomiting.  She had a known history of biliary  sludge and chronic cholecystitis with a low gallbladder ejection  fraction.  A surgery consultation was obtained.  The patient was noted  to be hypokalemic at the time of admission and she was given potassium  supplementation.  She ultimately was taken to the operating room for  laparoscopic cholecystectomy on December 27, 2008.  Informed consent was  obtained from the patient's power of attorney.  She tolerated the  procedure well.  Postoperative course has been unremarkable.  Her diet  was advanced without difficulty.   The patient is being discharged back to [facility] on postoperative day #1  in good improving condition.   DISCHARGE INSTRUCTIONS:  The patient is to follow up Dr. Franky Macho as  needed.  She is to follow up with Dr. Avon Gully as per routine.   DISCHARGE MEDICATIONS:  1. Protonix 40 mg p.o. daily.  2. Colace 100 mg p.o. q.12 h.  3. Aricept 10 mg p.o. daily.  4. Magnesium oxide 400 mg p.o. daily.  5. Calcium carbonate 500 mg p.o. b.i.d.  6. Simvastatin 10 mg p.o. daily.  7. Namenda 5 mg p.o. b.i.d.  8. MiraLax 17 grams p.o. daily p.r.n. constipation.   PRINCIPAL DIAGNOSES:  1. Chronic cholecystitis.  2. Dementia.  3. History of anemia of chronic disease.  4. Constipation.  5. History of syncopal episodes.  6. History of cerebral  artery stenosis.   PRINCIPAL PROCEDURE:  Laparoscopic cholecystectomy on December 27, 2008.      Dalia Heading, M.D.  Electronically Signed     MAJ/MEDQ  D:  12/28/2008  T:  12/28/2008  Job:  [id number]   cc:   [facility]  [place]   Tesfaye D. Felecia Shelling, MD  Fax: [phone]

## 2011-03-16 NOTE — Discharge Summary (Signed)
[name], [name]                 ACCOUNT NO.:  [id number]   MEDICAL RECORD NO.:  [id number]          PATIENT TYPE:  INP   LOCATION:  5005                         FACILITY:  [facility]   PHYSICIAN:  Richarda Overlie, MD       DATE OF BIRTH:  1917-04-28   DATE OF ADMISSION:  10/17/2008  DATE OF DISCHARGE:  10/28/2008                               DISCHARGE SUMMARY   ADDENDUM:  Kindly refer to discharge summary from October 21, 2008 for  further details of the hospitalization.   INTERIM DISCHARGE SUMMARY:  The patient's discharge was held because of  a lateral malleolar fracture of the lateral malleolus which was found to  be nondisplaced.  Orthopedic consultation was requested and the patient  was evaluated by [name]. Dalldorf, M.D. who recommended a splint with  weightbearing after 4 weeks.  SNF was also recommended.  The patient had  repeat films of the right ankle that showed no interval displacement of  the suspected nondisplaced lateral malleolar fracture.  The mortise  joint alignment remained within normal limits.  [name], M.D. who  requested the radiograph was requested to follow up today to see if the  patient was stable for discharge to SNF.  The patient did have some  issues with constipation prior to the discharge however, this was  resolved and she was started on an aggressive constipation protocol.  Prior to discharge she was found to be hemodynamically stable.   Vital signs documented prior to discharge:  139/63, pulse of 84,  respirations 18, temperature 97.4, 96% on room air.   DISCHARGE MEDICATIONS:  1. Percocet 5/325 p.o. q.8h. p.r.n. pain.  2. Lipitor 20 mg p.o. daily.  3. Ultram 100 mg p.o. q.12h.  4. Aggrenox 25/200 p.o. q.12h.  5. Colace 200 mg p.o. q.12h.  6. Senna two tablets p.o. nightly.  7. Aricept 10 mg p.o. daily.  8. Calcium 600 mg p.o. twice a day.  9. Namenda 5 mg twice a day.  10.Hydrochlorothiazide 25 mg p.o. daily.  11.Magnesium oxide 400  mg p.o. daily.   DISCHARGE PLAN:  Home care will be set up once the patient is discharged  from the skilled nursing facility.  Follow up with [name], M.D.  in [place] in 5-7 days.      Richarda Overlie, MD  Electronically Signed     NA/MEDQ  D:  10/28/2008  T:  10/28/2008  Job:  [id number]

## 2011-03-16 NOTE — Procedures (Signed)
This is a nearly 77 year old female patient of Dr. Cristal Deer Oti  referred today on October 18, 2008, for a 16-channel EEG recording with  one channel representing heart rate and rhythm exclusively.  The patient  is described as awake and asleep by the technician.  There is no further  indication if the patient is confused or oriented or able to answer.  Activation procedures included only photic stimulation.   The patient is on current medications of heparin, Protonix, Plavix,  Xanax, Zofran, Tylenol, oxycodone, Dilaudid, and Aricept.   This 77 year old female is admitted on October 17, 2008, for syncope,  which has been a recurrent episode.  He has a history of organic brain  syndrome dementia and is also found to have UTI.   DESCRIPTION:  Posterior dominant rhythm for this patient, it is  determined at 8 Hz.  EKG shows normal sinus rhythm.  The patient  frequently talks and cough and call this as some movement artifacts and  makes the interpretation of the study more difficult.  The patient is  multiple times in the study drowsy not to fall temporarily asleep and  seems to react to external stimuli such as a phone ringing or the nurse  entering the bedroom with increased alertness, which cannot be  maintained for a long time.  Sleep dominates approximately 70% of this  recording.  The application of photic stimulation appears not to cause  any responses by EEG, and the patient is described as softly snoring by  photic stimulation is applied.  There was rhythmic eye blinking artifact  in response to the lower frequency.  Photic stimulation seen, but no  epileptiform discharges or focality is noted.   CONCLUSION:  This is a drowsy and asleep EEG, but also shows generalized  slowing at 7 Hz posterior dominant frequency, and the patient is  supposingly awake with eyes closed.  EKG was normal limits.   The findings of this EEG correlate with encephalopathy of diffuse  origin.   There is no focality or seizure activity noted.      Melvyn Novas, M.D.  Electronically Signed     ZO:XWRU  D:  10/18/2008 21:36:59  T:  10/19/2008 05:36:29  Job #:  [id number]

## 2011-03-16 NOTE — Op Note (Signed)
[name], [name]                 ACCOUNT NO.:  [id number]   MEDICAL RECORD NO.:  [id number]         PATIENT TYPE:  PINP   LOCATION:  A301                          FACILITY:  [facility]   PHYSICIAN:  Dalia Heading, M.D.  DATE OF BIRTH:  [birth year]   DATE OF PROCEDURE:  12/27/2008  DATE OF DISCHARGE:                               OPERATIVE REPORT   PREOPERATIVE DIAGNOSIS:  Chronic cholecystitis.   POSTOPERATIVE DIAGNOSIS:  Chronic cholecystitis.   PROCEDURE:  Laparoscopic cholecystectomy.   SURGEON:  Dalia Heading, MD   ANESTHESIA:  General endotracheal.   INDICATIONS:  The patient is an 77 year old black female who presents  with nausea and vomiting secondary to chronic cholecystitis.  The risks  and benefits of the procedure including bleeding, infection,  hepatobiliary injury, the possibility of an open procedure were fully  explained to the patient's power of attorney, who gave informed consent  for the patient as the patient suffers from dementia.   PROCEDURE NOTE:  The patient was placed in the supine position.  After  induction of general endotracheal anesthesia, the abdomen was prepped  and draped using the usual sterile technique with Betadine.  Surgical  site confirmation was performed.   An infraumbilical incision was made down to the fascia.  A Veress needle  was introduced into the abdominal cavity and confirmation of placement  was done using the saline drop test.  The abdomen was then insufflated  to 16 mmHg pressure.  An 11-mm trocar was introduced into the abdominal  cavity under direct visualization without difficulty.  The patient was  placed in reverse Trendelenburg position.  An additional 11-mm trocar  was placed in the epigastric region, and 5-mm trocars were placed in the  right upper quadrant and right flank regions.  Liver was inspected and  noted to be within normal limits.  There was a small hemangioma noted  just medial to the gallbladder fossa.   The gallbladder was retracted  superior and laterally.  The dissection was begun around the  infundibulum of the gallbladder.  The cystic duct was first identified.  Its juncture to the infundibulum was fully identified.  EndoClips were  placed proximally and distally on the cystic duct, and the cystic duct  was divided.  This likewise was done of the cystic artery.  The  gallbladder was then freed away from the gallbladder fossa using Bovie  electrocautery.  The gallbladder was delivered through the epigastric  trocar site using EndoCatch bag.  The gallbladder fossa was inspected,  and no abnormal bleeding or bile leakage was noted.  Surgicel was placed  in the gallbladder fossa.  All fluid and air were then evacuate from the  abdominal cavity prior to removal of the trocars.   All wounds were irrigated with normal saline.  All wounds were checked  with 0.5% Sensorcaine.  The infraumbilical fascia as well as the  epigastric fascia were reapproximated using 0 Vicryl interrupted  sutures.  All skin incisions were closed using 4-0 Vicryl subcuticular  sutures.  Dermabond was then applied.   All tape and needle  counts were correct at the end of the procedure.  The patient was extubated in the operating room and went back to  recovery room, awake in stable condition.   COMPLICATIONS:  None.   SPECIMEN:  Gallbladder.   BLOOD LOSS:  Minimal.      Dalia Heading, M.D.  Electronically Signed     MAJ/MEDQ  D:  12/27/2008  T:  12/27/2008  Job:  [id number]   cc:   Tesfaye D. Felecia Shelling, MD  Fax: [phone]

## 2011-03-16 NOTE — H&P (Signed)
[name], [name] NO.:  [id number]   MEDICAL RECORD NO.:  [id number]          PATIENT TYPE:  EMS   LOCATION:  MAJO                         FACILITY:  [facility]   PHYSICIAN:  Isidor Holts, M.D.  DATE OF BIRTH:  1917/10/16   DATE OF ADMISSION:  10/17/2008  DATE OF DISCHARGE:                              HISTORY & PHYSICAL   PMD:  Dr. [name], [place], [place].   CHIEF COMPLAINT:  Syncopal episode today.   HISTORY OF PRESENT ILLNESS:  This is an 77 year old female.  For past  medical history, see below.  Patient is demented, confused, and a poor  historian, so history is supplied by patient's daughter who was present  in the emergency department. According to her, patient was feeling quite  well in her usual state of health all day today. They therefore decided  to go shopping.  While at the shops after looking around, patient's  daughter went to the counter to pay for her purchases and when she  turned around, patient was no longer to be found. She had apparently  wandered off.  Patient's daughter went outside and found patient lying  on the pavement.  Precise circumstances of fall are not known, however,  it appears that patient may have passed out, however, patient was  alert and communicative at the time her daughter found her.  She had no  voluntary movements and had no tongue biting, frothing at the mouth,  fecal or urinary incontinence.  The store personnel called emergency  medical services, who came by in about 10 minutes, and transported  patient to the emergency department, where she was evaluated by the code  stroke team, found not to have any focal neurology and was referred to  the medical service.   PAST MEDICAL HISTORY:  1. Organic brain syndrome/dementia.  2. Recurrent syncopal episodes, has been extensively worked up in the      past.  3. Vertebrobasilar insufficiency.  4. Chronic macrocytic anemia.   MEDICATION HISTORY:  1. Aricept 10 mg p.o. daily.  2. Plavix 75 mg p.o. daily.   ALLERGIES:  NO KNOWN DRUG ALLERGIES.   SYSTEMS REVIEW:  As per HPI, otherwise negative.  No antecedent upper  respiratory tract illness.  No fever, chills.  No urinary frequency or  incontinence.   SOCIAL HISTORY:  Patient resides with her husband, who does the cooking  and the shopping.  She is otherwise active, able to manage her own ADLs  and ambulates without assistance.  Nonsmoker.  Nondrinker.  No history  of drug abuse.   FAMILY HISTORY:  Noncontributory.   PHYSICAL EXAMINATION:  VITALS:  Temperature 97.4.  Pulse 80 per minute,  regular.  Respiratory rate 25.  BP 149/80 mmHg.  Pulse oximetry 100% on  room air.  Patient did not appear to be in obvious acute distress at  time of this evaluation, albeit pleasantly confused, alert,  communicative, not short of breath at rest.  HEENT:  Mild clinical pallor.  No jaundice.  No conjunctival injection.  Throat is clear.  NECK:  Supple.  JVP not seen.  No palpable lymphadenopathy.  No palpable  goiter.  CHEST:  Clinically clear to auscultation.  No wheezes.  No crackles.  HEART:  Sounds 1 and 2 are heard.  Normal, regular.  No murmurs.  ABDOMEN:  Full, soft, and nontender.  No palpable organomegaly.  No  palpable masses.  Normal bowel sounds.  LOWER EXTREMITY EXAMINATION:  No pitting edema.  Palpable peripheral  pulses.  MUSCULOSKELETAL SYSTEM:  Osteoarthritic changes are noted, otherwise  unremarkable..  CENTRAL NERVOUS SYSTEM:  No focal neurologic deficits on gross  examination.   INVESTIGATIONS:  CBC, WBC 9.0, hemoglobin 11.5, hematocrit 36.3, MCV  71.5, platelets 187.  Electrolytes, sodium 137, potassium 5.6, chloride  98, CO2 of 24, BUN 12, creatinine 1.15, glucose 102, AST 49, ALT 22,  alkaline phosphatase 98, troponin I point of care less than 0.01.  Head  CT scan dated October 17, 2008, shows atrophy with microvascular white  matter disease, no definite acute  or focal lesion.  There were changes  of chronic sinusitis.   ASSESSMENT AND PLAN:  1. Syncopal episode.  It is not entirely clear that patient may have      indeed suffered a syncopal episode as there is no observer account,      and when her daughter found her on the pavement, she was awake,      alert, communicative.  She may simply have fallen, however, she has      no recollection of the event and is unable to describe any      antecedent events.  Other etiologies of course, have to be      considered, including possibly a CVA, TIA, or seizure episode.  As      mentioned in past medical history, patient has a history of      vertebrobasilar insufficiency and recurrent syncopal episodes.  As      a matter of fact, she has been evaluated with a 4-vessel cerebral      angiogram by Dr. Julieanne Cotton, interventional radiologist, on      September 14, 2005.  This showed approximately 50% stenosis of the      distal left vertebrobasilar junction, mild stenosis of the right      vertebrobasilar junction, and 30% stenosis of the proximal right      vertebral artery.  According to Dr. Corliss Skains, patient was      recommended to continue with medical therapy, although endovascular      stenting of the left vertebrobasilar junction could be undertaken      in case of failed medical treatment.  Followup MRA was arranged.      This was carried out on November 19, 2005, and showed 2 focal areas      of narrowing along the left vertebrobasilar junction with 1 of the      distal stenoses at this site being approximately 50% to 75%.  There      was approximately 25% to 50% stenosis of the right vertebrobasilar      junction and focal areas of atherosclerosis along the right      posterior cerebral artery, 50% to 75% stenosis of supraclinoid      right ICA.  No focal areas of aneurysms were seen.  It is not      entirely clear what therapeutic measures were recommended following      this study,  however, patient's family informs me that patient has  an appointment scheduled with a Dr. Hebert Soho at [place] on      October 18, 2008, for further workup and possibly a cardiac      monitor.  Be that as it may, we shall admit the patient for      observation, telemetric monitoring, do neuro checks, do a brain      MRI/MRA with cervical MRA, do an EEG, check orthostatics, and for      now commence patient on intravenous fluid hydration.  We shall of      course continue Plavix.   1. Dementia.  This appears stable, we shall continue pre-admission      doses of Aricept.   1. History of microcytic anemia.  Hemoglobin appears currently      reasonable at 11.5. We shall do ironstudies, as if iron deficiency      is demonstrated, the patient may benefit from iron supplements.   Further management will depend on clinical course.      Isidor Holts, M.D.  Electronically Signed     CO/MEDQ  D:  10/17/2008  T:  10/17/2008  Job:  [id number]   cc:   [name], M.D.

## 2011-03-16 NOTE — Group Therapy Note (Signed)
NAMESABLE, KNOLES                 ACCOUNT NO.:  [id number]   MEDICAL RECORD NO.:  [id number]          PATIENT TYPE:  INP   LOCATION:  5005                         FACILITY:  [facility]   PHYSICIAN:  Richarda Overlie, MD       DATE OF BIRTH:  1917/02/17                                 PROGRESS NOTE   PRIORITY PROGRESS NOTE   Ms. [name] has done well.  Her right foot pain continues to  improve.  She had a small bowel movement last night.  Her pain is much  better controlled.  She denies any nausea or vomiting.   PHYSICAL EXAMINATION:  VITAL SIGNS:  Temperature 97.0. pulse 74, respirations 18, blood  pressure 139/63, 96% on room air.  HEENT:  Pupils are equal and reactive, and extraocular movements intact.  LUNGS:  Clear to auscultation.  CARDIOVASCULAR:  Regular rate and rhythm, no appreciable murmurs, rubs,  or gallops.  ABDOMEN:  Soft, nontender, and nondistended.  EXTREMITIES:  Without cyanosis, clubbing, or edema.   1. Syncope not noticed since admission and this has been a stable      problem.  2. Right foot malleolar fracture.  Radiographs reviewed and the      patient has no internal displacement of the nondisplaced lateral      malleolar fracture , appreciate Dr. Nolon Nations input.   DISPOSITION:  The patient awaiting discharge to SNF.  Hopefully, this will be arranged  tomorrow.      Richarda Overlie, MD  Electronically Signed     NA/MEDQ  D:  10/28/2008  T:  10/28/2008  Job:  [id number]

## 2011-03-16 NOTE — H&P (Signed)
NAME:  [name], [name]                 ACCOUNT NO.:  [id number]   MEDICAL RECORD NO.:  [id number]          PATIENT TYPE:  INP   LOCATION:  A305                          FACILITY:  [facility]   PHYSICIAN:  Tesfaye D. Felecia Shelling, MD   DATE OF BIRTH:  1917/08/04   DATE OF ADMISSION:  12/25/2008  DATE OF DISCHARGE:  [facility]                              HISTORY & PHYSICAL   CHIEF COMPLAINT:  Nausea, vomiting, abdominal pain.   HISTORY OF PRESENT ILLNESS:  This is 77 year old female patient with a  history of dementia and anemia of chronic disease who was admitted  directly from nursing home due to recurrent nausea and vomiting.  She  has been in the nursing home for the last 2 months.  However, the  patient has been complaining of nausea, vomiting and abdominal pain  since she has been in the nursing home.  Her symptoms continued to  deteriorate.  The patient was not able to take significant oral feeding.  She was referred to GI and the patient was evaluated.  She underwent  endoscopy. However, there was no significant finding.  Her ultrasound  showed sludge in her gallbladder.  And HIDA scan was done and it has  showed a nonfunctioning gallbladder.  After discussing the patient's  condition with Dr. Lovell Sheehan, a general surgeon, the patient was directly  admitted for possible laparoscopic cholecystectomy.   REVIEW OF SYSTEMS:  The patient has no fever, headache, or shortness of  breath, cough, chest pain, dysuria, urgency or frequency of urination.   PAST MEDICAL HISTORY:  1. Dementia.  2. History of anemia of chronic disease.  3. Constipation.  4. Syncopal episode.  5. Fracture of the ankle.  6. History of cerebral artery stenosis.   CURRENT MEDICATIONS:  1. Prevacid 10 mg daily.  2. Hydrochlorothiazide 12.5 mg daily.  3. Magnesium oxide 400 mg daily.  4. Aggrenox 25 mg/200 one tablet p.o. q.12 h.  5. Calcium 600 mg twice daily.  6. Colace 100 mg daily.  7. Tramadol 50 mg 2 tablets p.o. q.12 h.  p.r.n.  8. Simvastatin 40 mg daily.  9. Percocet 1 tablet q.8 h. p.r.n.  10.Promethazine 12.5 p.o. q.6 h. p.r.n.   SOCIAL HISTORY:  The patient is a resident of nursing home.  There is no  history of alcohol, tobacco or substance abuse.   PHYSICAL EXAMINATION:  GENERAL:  The patient is alert, awake,  chronically sick-looking.  VITAL SIGNS: Blood pressure of 150/67, pulse 70, respiratory rate 18,  temperature 97.9 degrees Fahrenheit.  HEENT: Pupils are equal and  reactive.  NECK: Supple.  CHEST:  Decreased air entry, few rhonchi.  CARDIOVASCULAR:  No first and second heart sounds heard.  No murmur, no  gallop.  ABDOMEN: Soft and lax.  Bowel sounds positive.  No mass or organomegaly.  EXTREMITIES: No leg edema.   LABS:  Sodium 140, potassium 3.1, chloride 99, carbon dioxide 34,  glucose 98, BUN 14, creatinine 1, calcium 8.6.  CBC WBC 5.8, hemoglobin  9.7, hematocrit 30.2 and platelet 268.   ASSESSMENT:  This is an 77 year old  female patient with a history of  recurrent nausea, vomiting who was admitted for possible  cholecystectomy.  The patient's ultrasound is showing sludge in her  gallbladder.  And she has also a nonfunctioning gallbladder on HIDA  scan.  Her gastrointestinal workup otherwise was within the normal  limits.  The patient has baseline dementia.  However, she otherwise is  medically stable.  I have discussed with Dr. Lovell Sheehan and it would be  worthwhile trying to do cholecystectomy and possibly that will solve her  problem of nausea and vomiting and abdominal pain.   PLAN:  Will be according to Dr. Lovell Sheehan' recommendation for possible  cholecystectomy.      Tesfaye D. Felecia Shelling, MD  Electronically Signed     TDF/MEDQ  D:  12/26/2008  T:  12/26/2008  Job:  [id number]

## 2011-03-19 NOTE — Procedures (Signed)
[name], [name]                 ACCOUNT NO.:  [id number]   MEDICAL RECORD NO.:  [id number]          PATIENT TYPE:  INP   LOCATION:  A215                          FACILITY:  [facility]   PHYSICIAN:  Dani Gobble, MD       DATE OF BIRTH:  03/01/1920   DATE OF PROCEDURE:  07/30/2005  DATE OF DISCHARGE:                                  ECHOCARDIOGRAM   REFERRING PHYSICIAN:  Dr. Sherwood Gambler.   INDICATIONS:  An 77 year old female with history of syncope.   Technical quality of study is adequate.   M-MODE TRACINGS:  The aorta measures within normal limits at 3.1 cm.   The left atrium also measures within normal limits measured at 3.1 cm.   The interventricular septum is thickened and measured at 1.3 cm.  Posterior  wall is within normal limits at 1.1 cm.   The aortic valve appeared moderately thickened, but with normal leaflet  excursion.  Mild aortic insufficiency is noted.  Doppler interrogation of  the aortic valve reveals peak velocity of 1.2 m/sec corresponding to a peak  gradient of 6 mmHg and a mean gradient of 4 mmHg.   The mitral valve appears mildly thickened, but with normal leaflet  excursion.  No mitral valve prolapse is noted.  Mild mitral annular  calcification is noted.  Trivial mitral regurgitation is noted.  Doppler  interrogation of the mitral valve is within normal limits.   The pulmonic valve is not well-visualized.   The tricuspid valve appears grossly structurally normal with trivial  tricuspid regurgitation noted.   The left ventricular cavity size is small.  Overall left ventricular  systolic function is normal and no regional wall motion abnormalities noted.  The presence of diastolic function is inferred from pulse wave Doppler  across the mitral valve.   The right atrium, right ventricle normal in size.  Right ventricular  systolic function is normal.  There is a small circumferential pericardial  effusion present without evidence of hemodynamic compromise.   The  pericardium does appear somewhat thickened, although it is not imaged well  enough to measure.   IMPRESSION:  1.  Mild asymmetric septal hypertrophy.  2.  Mild to moderate aortic sclerosis without stenosis.  3.  Trace mitral and tricuspid regurgitation.  4.  Small left ventricular cavity size with normal left ventricular systolic      function and no regional wall motion abnormalities noted.  5.  Small circumferential pericardial effusion without evidence of      hemodynamic compromise.  6.  The pericardium appears thickened, although it is not imaged well enough      for measurement.  7.  Consider cardiac magnetic resonance imaging for enhanced visualization      of the pericardium if clinically indicated.           ______________________________  Dani Gobble, MD     AB/MEDQ  D:  07/30/2005  T:  07/30/2005  Job:  [id number]

## 2011-03-19 NOTE — H&P (Signed)
NAME:  [name], [name]                 ACCOUNT NO.:  [id number]   MEDICAL RECORD NO.:  [id number]          PATIENT TYPE:  AMB   LOCATION:  DFTL                         FACILITY:  [facility]   PHYSICIAN:  Sanjeev K. Deveshwar, M.D.DATE OF BIRTH:  03/01/1920   DATE OF ADMISSION:  09/14/2005  DATE OF DISCHARGE:                                HISTORY & PHYSICAL   CHIEF COMPLAINT:  The patient is here for a cerebral angiogram and possible  PTA stenting of the vertebral arteries due to history of vertebral artery  stenosis.   HISTORY OF PRESENT ILLNESS:  This is a pleasant 77 year old female with a  history of dementia who was recently admitted to [facility]  July 29, 2005, to August 02, 2005, by Dr. Sherwood Gambler after she had a  syncopal episode while at church.  A cardiology consult was obtained.  The  patient had a 2-D echo on July 30, 2005, that showed normal LV systolic  function with a small pericardial effusion.   The patient had an MRA performed July 29, 2005, that showed bilateral  vertebral artery stenosis.  She had a carotid ultrasound performed as well.  The ultrasound showed less than 50% stenosis of the internal carotid  arteries bilaterally.  Patient was treated with Plavix.  She was discharged  from the hospital and referred to Dr. Corliss Skains who saw the patient on  August 09, 2005.  The family reports that she has had several syncopal  episodes, two of which occurred in church, one occurred at home.  There was  a question of another episode that occurred while working in her yard.  The  patient is unable to give much history due to dementia.   ALLERGIES:  NO KNOWN DRUG ALLERGIES.   CURRENT MEDICATIONS:  1.  Plavix 75 mg daily.  2.  Aricept 10 mg daily.  3.  Meclizine p.r.n.  4.  Vitamin E daily.  The patient did receive aspirin, Plavix and nimodipine this morning.  Apparently she does not take aspirin on a daily. basis.   PAST MEDICAL HISTORY:  1.   Organic brain syndrome.  2.  History of mild anemia.   PAST SURGICAL HISTORY:  Remote C. section.   SOCIAL HISTORY:  The patient is married.  She has five children.  She lives  in [place], [place], with her husband.  She has never used  tobacco.  She does not use alcohol.  She is retired from [employer].   FAMILY HISTORY:  Her mother died in her 57s from cancer.  Father's medical  history is unknown.   REVIEW OF SYSTEMS:  Completely negative per the patient's history, although  she does have dementia and the accuracy is probably unreliable.   LABORATORY DATA:  INR 0.9, PTT 25.  CBC revealed hemoglobin 12, hematocrit  37.3, wbc 5.9000, platelets 245,000.  BUN 12, creatinine 1.0, potassium 4.5,  glucose 97.   PHYSICAL EXAMINATION:  GENERAL APPEARANCE:  A very pleasant 77 year old  African American female in no acute distress.  VITAL SIGNS:  Blood pressure 144/70, pulse 67, respirations  20, temperature  96.3, oxygen saturation 97%.  Her airway was rated at a 4.  Her ASA scale  rated at a 2.  HEENT:  Unremarkable except she is missing quite a few teeth.  NECK:  No bruits, no jugular venous distension.  LUNGS:  Clear.  CARDIOVASCULAR:  Regular rate and rhythm without murmur.  ABDOMEN:  Soft, nontender.  EXTREMITIES:  No significant edema.  Pulses are weak to absent.  SKIN:  Warm and dry.  NEUROLOGIC:  The patient is alert.  She is oriented to person. As far as  place, she is oriented to the fact that she is in the hospital.  She cannot  name the hospital.  She knows she is in [place], [place].  She has  no idea what day it is.  Cranial nerves II-XII grossly intact.  Sensation is  intact to light touch.  Motor strength is 4/5 throughout.  Cerebellar  testing is slow but intact.   IMPRESSION:  1.  History of syncope.  2.  Vertebral artery stenosis by MRA performed July 29, 2005.  3.  Mild bilateral carotid stenosis.  4.  History of  dementia/organic brain syndrome.  5.  History of mild anemia.  6.  History of 2-D echo revealing normal left ventricular systolic function      and a small pericardial effusion performed July 30, 2005.  7.  Question of mild hypertension.   PLAN:  As noted, the patient will undergo cerebral angiogram today to be  performed by Dr. Corliss Skains to evaluate for vertebral artery stenosis.  If  she is found to have significant stenosis and if it is felt safe to proceed,  Dr. Corliss Skains will perform PTA stenting of the stenosis.  This will be  performed under general anesthesia.      Delton See, P.A.    ______________________________  Grandville Silos. Corliss Skains, M.D.    DR/MEDQ  D:  09/14/2005  T:  09/14/2005  Job:  [id number]   cc:   [name], M.D.  Fax: [phone]

## 2011-03-19 NOTE — Discharge Summary (Signed)
[name], LADY                 ACCOUNT NO.:  [id number]   MEDICAL RECORD NO.:  [id number]          PATIENT TYPE:  INP   LOCATION:  A215                          FACILITY:  [facility]   PHYSICIAN:  Madelin Rear. Sherwood Gambler, MD  DATE OF BIRTH:  03/01/1920   DATE OF ADMISSION:  07/29/2005  DATE OF DISCHARGE:  10/02/2006LH                                 DISCHARGE SUMMARY   DISCHARGE DIAGNOSIS:  Vertebrobasilar insufficiency manifest by syncope and  vertigo.   DISCHARGE MEDICATIONS:  1.  Plavix 75 mg p.o. daily.  2.  Aricept 5 mg p.o. daily.  3.  Vasotec 5 mg p.o. daily.  4.  Cipro 250 mg p.o. b.i.d. x3 days.   HOSPITAL COURSE:  The patient was admitted with recurrent episodes of vague  syncope and/or vertigo.  There was no positional relationship to it.  Concerns over possible cardiogenic etiology prompted admission for telemetry  monitoring as well as cardiology consultation.  She was found to have no  arrhythmias and remained stable in the hospital with an MRA/MRI showing  probably hemodynamically significant vertebral artery stenosis.  This was  evaluated and discussed in consultation with radiology at [facility] and it  was decided to schedule her for formal angiography and possible stent  placement on October 5.  She was observed and again had no recurrent  neurologic event.  Anticoagulations were discontinued, i.e. Lovenox and she  was placed on Plavix and tolerated well.  On the day of discharge, she was  neurologically intact and asymptomatic.      Madelin Rear. Sherwood Gambler, MD  Electronically Signed     LJF/MEDQ  D:  08/02/2005  T:  08/02/2005  Job:  [id number]

## 2011-03-19 NOTE — Consult Note (Signed)
[name], [name]                 ACCOUNT NO.:  [id number]   MEDICAL RECORD NO.:  [id number]          PATIENT TYPE:  OUT   LOCATION:  XRAY                         FACILITY:  [facility]   PHYSICIAN:  Sanjeev K. Deveshwar, M.D.DATE OF BIRTH:  03/01/1920   DATE OF CONSULTATION:  08/09/2005  DATE OF DISCHARGE:                                   CONSULTATION   CHIEF COMPLAINT:  History of syncope with vertebral artery stenosis.   HISTORY OF PRESENT ILLNESS:  This is a pleasant 77 year old female who was  recently admitted to [facility] July 29, 2005 to August 02, 2005 by Dr. Sherwood Gambler after she had a syncopal episode while at church.  A  cardiology consult was obtained.  There apparently was no indication that  this was due to an arrhythmia.  The patient had a 2-D echocardiogram  performed on July 30, 2005.  This showed normal LV systolic function  with a small pericardial effusion.   The patient had an MRA performed on July 29, 2005 that showed bilateral  vertebral artery stenosis.  She had a carotid artery ultrasound performed as  well.  This showed less than 50% stenosis of the internal carotid arteries  bilaterally.  The patient has been treated with Plavix.  She has been  discharged from the hospital and referred to Dr. Corliss Skains for further  evaluation.  Apparently she has had several syncopal episodes recently, two  of which have occurred in church.  One occurred in the attack of her home  and there is a question of another syncopal episode which occurred while  working in the yard.   PAST MEDICAL HISTORY:  Significant for organic brain syndrome with recent  mild anemia and a recent urinary tract infection treated with Cipro.   PAST SURGICAL HISTORY:  Significant for a cesarean section.   ALLERGIES:  No known drug allergies.   MEDICATIONS:  1.  Plavix 75 mg daily.  2.  Aricept 5 mg daily.  3.  Vasotec 5 mg daily.  4.  Apparently she is not taking aspirin as  far as we can tell.  5.  As noted, she was recently treated with Cipro for a urinary tract      infection.   SOCIAL HISTORY:  The patient is married.  She has five children.  She lives  in [place] with her husband.  She has never used tobacco.  She does not  use alcohol.  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   FAMILY HISTORY:  Her mother died in her 57s from cancer.  Her father's  medical history is unknown.   REVIEW OF SYSTEMS:  Negative for any history of headaches, dizziness, visual  changes, dysarthria, dysphagia, or weakness of the extremities.  She denies  any seizure activity associated with her syncopal episodes.   IMPRESSION:  1.  Multiple syncopal episodes.  2.  Vertebral artery stenosis by recent MRA.  3.  History of organic brain syndrome.  4.  Mild anemia.  5.  Recent urinary tract infection.   PLAN:  Dr. Corliss Skains  had a long talk with the patient and her daughter who  accompanied the patient today.  He went over the results of the MRA scan  with the vertebral artery stenosis.  He discussed the risks and benefits of  treatment with the treatment options including watchful waiting and possible  PTA stenting.  He asked the patient and her daughter to go home and think  about the situation and contact him if they would like to proceed with a  cerebral angiogram and possible PTA/stenting of the stenosis.  They will  contact Dr. Corliss Skains when they have made their final decision.   Dr. Corliss Skains recommended if she were to have the angiogram with possible  PTA stenting that aspirin be added to her medications at least three days  prior to the intervention.   Greater than 40 minutes was spent on this consult today.      Delton See, P.A.    ______________________________  Grandville Silos. Corliss Skains, M.D.    DR/MEDQ  D:  08/09/2005  T:  08/09/2005  Job:  [id number]   cc:   [name], M.D.  Fax: [phone]

## 2011-03-19 NOTE — H&P (Signed)
NAMEVELINA, DROLLINGER                 ACCOUNT NO.:  [id number]   MEDICAL RECORD NO.:  [id number]          PATIENT TYPE:  INP   LOCATION:  A215                          FACILITY:  [facility]   PHYSICIAN:  Madelin Rear. Sherwood Gambler, MD  DATE OF BIRTH:  03/01/1920   DATE OF ADMISSION:  07/29/2005  DATE OF DISCHARGE:  LH                                HISTORY & PHYSICAL   CHIEF COMPLAINT:  Syncope.   HISTORY OF PRESENT ILLNESS:  The patient has had repetitive bouts of syncope  x3 noted over the past week or two. She states one time it happened in  church. She describes it as dizziness, but it is hard to get her to  describe true vertigo. She states her vision does change, and she feels  lightheaded. She also did admit to feeling off balance. Apparently, she was  started on some Antivert in an attempt to treat possible vertigo with no  success. She denies any headache or trauma. She did not fall or hurt  herself. She denies any palpitations, chest pain, shortness of breath. There  has been no hematemesis, hematochezia or melena. No abdominal pain. She  denies prodromal nausea prior to the event.   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   SOCIAL HISTORY:  She does not smoke, drink or use any drugs.   FAMILY HISTORY:  Positive for hypertension, coronary artery disease,  diabetes.   REVIEW OF SYSTEMS:  Under HPI. All else is negative.   PHYSICAL EXAMINATION:  GENERAL:  She is awake, alert, and cooperative and  oriented x3.  HEENT:  Head and neck showed no JVD or adenopathy.  NECK:  Supple.  CHEST:  Clear.  CARDIAC:  Regular rhythm. No gallop or rub.  ABDOMEN:  No organomegaly or masses.  EXTREMITIES:  Without clubbing, cyanosis, or edema.  NEUROLOGICAL:  Nonfocal. She does have bilateral lateral nystagmus. However,  stressing her with vigorous movement of the head caused no symptoms of  vertigo.   STUDIES:  Chest x-ray revealed cardiomegaly without congestive heart  failure  or other acute disease. CBC revealed a 4.8 white count without left shift, a  mild anemia at 11.2 g/% hemoglobin with microcytic indices. Platelet count  was normal. Urinalysis showed small leukocytes but otherwise positive for 11  to 20 white cells per high powered field. Initial cardiac enzymes were  negative. Electrolytes revealed mild hypokalemia at 3.2. All else was okay.   IMPRESSION:  1.  Syncope, recurrent. Rule out cardiogenic. Admit to 2-A telemetry      monitor. Intervene as needed.  2.  Urinary tract infection. Oral antibiotics appear adequate at present.      These will be started in house.  3.  Anemia with microcytic indices. Rule out iron deficiency. Will check a      hemoccult stool, iron levels, B12 and folate.  4.  Organic brain syndrome. Continue outpatient therapy.      Madelin Rear. Sherwood Gambler, MD  Electronically Signed     LJF/MEDQ  D:  07/29/2005  T:  07/29/2005  Job:  [id number] 

## 2011-03-22 ENCOUNTER — Emergency Department (HOSPITAL_COMMUNITY): Payer: Medicare Other

## 2011-03-22 ENCOUNTER — Observation Stay (HOSPITAL_COMMUNITY)
Admission: AD | Admit: 2011-03-22 | Discharge: 2011-03-24 | Disposition: A | Discharge status: Home / Self Care | Payer: Medicare Other | Source: Ambulatory Visit | Attending: Internal Medicine | Admitting: Internal Medicine

## 2011-03-22 DIAGNOSIS — I658 Occlusion and stenosis of other precerebral arteries: Secondary | ICD-10-CM | POA: Insufficient documentation

## 2011-03-22 DIAGNOSIS — E869 Volume depletion, unspecified: Secondary | ICD-10-CM | POA: Insufficient documentation

## 2011-03-22 DIAGNOSIS — I6529 Occlusion and stenosis of unspecified carotid artery: Secondary | ICD-10-CM | POA: Insufficient documentation

## 2011-03-22 DIAGNOSIS — I10 Essential (primary) hypertension: Secondary | ICD-10-CM | POA: Insufficient documentation

## 2011-03-22 DIAGNOSIS — G450 Vertebro-basilar artery syndrome: Principal | ICD-10-CM | POA: Insufficient documentation

## 2011-03-22 DIAGNOSIS — E039 Hypothyroidism, unspecified: Secondary | ICD-10-CM | POA: Insufficient documentation

## 2011-03-22 DIAGNOSIS — Z8673 Personal history of transient ischemic attack (TIA), and cerebral infarction without residual deficits: Secondary | ICD-10-CM | POA: Insufficient documentation

## 2011-03-22 DIAGNOSIS — Z79899 Other long term (current) drug therapy: Secondary | ICD-10-CM | POA: Insufficient documentation

## 2011-03-22 DIAGNOSIS — R269 Unspecified abnormalities of gait and mobility: Secondary | ICD-10-CM | POA: Insufficient documentation

## 2011-03-22 DIAGNOSIS — R55 Syncope and collapse: Secondary | ICD-10-CM | POA: Insufficient documentation

## 2011-03-22 DIAGNOSIS — F0390 Unspecified dementia without behavioral disturbance: Secondary | ICD-10-CM | POA: Insufficient documentation

## 2011-03-22 DIAGNOSIS — D649 Anemia, unspecified: Secondary | ICD-10-CM | POA: Insufficient documentation

## 2011-03-22 DIAGNOSIS — N289 Disorder of kidney and ureter, unspecified: Secondary | ICD-10-CM | POA: Insufficient documentation

## 2011-03-22 LAB — BASIC METABOLIC PANEL
BUN: 22 mg/dL (ref 6–23)
Creatinine, Ser: 1.31 mg/dL — ABNORMAL HIGH (ref 0.4–1.2)
GFR calc Af Amer: 46 mL/min — ABNORMAL LOW (ref >60)
GFR calc non Af Amer: 38 mL/min — ABNORMAL LOW (ref >60)
Potassium: 4.2 mEq/L (ref 3.5–5.1)

## 2011-03-22 LAB — URINALYSIS, ROUTINE W REFLEX MICROSCOPIC
Bilirubin Urine: NEGATIVE
Glucose, UA: NEGATIVE mg/dL
Ketones, ur: NEGATIVE mg/dL
Leukocytes, UA: NEGATIVE
Nitrite: NEGATIVE
Specific Gravity, Urine: 1.025 (ref 1.005–1.030)
pH: 6 (ref 5.0–8.0)

## 2011-03-22 LAB — CBC
Hemoglobin: 10.7 g/dL — ABNORMAL LOW (ref 12.0–15.0)
MCH: 21.4 pg — ABNORMAL LOW (ref 26.0–34.0)
MCV: 70.1 fL — ABNORMAL LOW (ref 78.0–100.0)
Platelets: 207 10*3/uL (ref 150–400)
RBC: 4.99 MIL/uL (ref 3.87–5.11)
WBC: 5.8 10*3/uL (ref 4.0–10.5)

## 2011-03-22 LAB — DIFFERENTIAL
Basophils Relative: 1 % (ref 0–1)
Eosinophils Absolute: 0.3 10*3/uL (ref 0.0–0.7)
Eosinophils Relative: 5 % (ref 0–5)
Lymphocytes Relative: 15 % (ref 12–46)
Monocytes Relative: 9 % (ref 3–12)
Neutro Abs: 4 10*3/uL (ref 1.7–7.7)
Neutrophils Relative %: 70 % (ref 43–77)

## 2011-03-22 LAB — URINE MICROSCOPIC-ADD ON

## 2011-03-23 ENCOUNTER — Observation Stay (HOSPITAL_COMMUNITY): Payer: Medicare Other

## 2011-03-23 LAB — DIFFERENTIAL
Basophils Relative: 1 % (ref 0–1)
Eosinophils Absolute: 0.3 10*3/uL (ref 0.0–0.7)
Neutro Abs: 4.4 10*3/uL (ref 1.7–7.7)
Neutrophils Relative %: 65 % (ref 43–77)

## 2011-03-23 LAB — COMPREHENSIVE METABOLIC PANEL
ALT: 9 U/L (ref 0–35)
AST: 17 U/L (ref 0–37)
Albumin: 3.6 g/dL (ref 3.5–5.2)
Alkaline Phosphatase: 101 U/L (ref 39–117)
CO2: 29 mEq/L (ref 19–32)
Chloride: 100 mEq/L (ref 96–112)
GFR calc Af Amer: 48 mL/min — ABNORMAL LOW (ref >60)
Potassium: 3.9 mEq/L (ref 3.5–5.1)
Sodium: 138 mEq/L (ref 135–145)
Total Bilirubin: 0.4 mg/dL (ref 0.3–1.2)

## 2011-03-23 LAB — MAGNESIUM: Magnesium: 2.5 mg/dL (ref 1.5–2.5)

## 2011-03-23 LAB — CARDIAC PANEL(CRET KIN+CKTOT+MB+TROPI)
CK, MB: 4.1 ng/mL — ABNORMAL HIGH (ref 0.3–4.0)
CK, MB: 5 ng/mL — ABNORMAL HIGH (ref 0.3–4.0)
Relative Index: 3.2 — ABNORMAL HIGH (ref 0.0–2.5)
Total CK: 72 U/L (ref 7–177)
Total CK: 127 U/L (ref 7–177)
Total CK: 162 U/L (ref 7–177)

## 2011-03-23 LAB — CBC
Hemoglobin: 11.1 g/dL — ABNORMAL LOW (ref 12.0–15.0)
Platelets: 251 10*3/uL (ref 150–400)
RBC: 5.17 MIL/uL — ABNORMAL HIGH (ref 3.87–5.11)
WBC: 6.8 10*3/uL (ref 4.0–10.5)

## 2011-03-23 LAB — T4, FREE: Free T4: 1.33 ng/dL (ref 0.80–1.80)

## 2011-03-24 LAB — BASIC METABOLIC PANEL
BUN: 20 mg/dL (ref 6–23)
CO2: 29 mEq/L (ref 19–32)
Chloride: 102 mEq/L (ref 96–112)
Potassium: 4 mEq/L (ref 3.5–5.1)

## 2011-03-24 LAB — URINE CULTURE
Colony Count: NO GROWTH
Culture: NO GROWTH

## 2011-03-24 NOTE — H&P (Signed)
[name], [name]                 ACCOUNT NO.:  [id number]  MEDICAL RECORD NO.:  [id number]           PATIENT TYPE:  O  LOCATION:  A209                          FACILITY:  [facility]  PHYSICIAN:  Vania Rea, M.D. DATE OF BIRTH:  03-19-1917  DATE OF ADMISSION:  03/22/2011 DATE OF DISCHARGE:  LH                             HISTORY & PHYSICAL   PRIMARY CARE PHYSICIAN:  Corrie Mckusick, MD  The patient's daughter is [name].  She is also healthcare power of attorney.  She lives in [place].  her telephone number is [phone].  The patient's guardian in [place] is Ms. Durant's friend, Candi Leash, her telephone number is [phone].  CHIEF COMPLAINT:  Syncopal episode.  HISTORY OF PRESENT ILLNESS:  This is a 77 year old lady who lives in [place] and has caregivers 24 hours a day, but whose daughters reside out of state.  The patient suffers with advanced dementia and also has a history of recurrent syncope associated with vertebrobasilar syndrome. The patient apparently has not had a syncopal episode for a while, but did have a brief syncopal episode 2 months ago.  While at home today, the patient has been more agitated than usual and was wheeled outside to get some fresh air.  After she got outside, she suddenly slumped over and was unconscious for about 5 minutes until EMS arrived.  By that time, she was pretty much back to normal.  She was brought to the emergency room for evaluation and no acute abnormalities were found. However, the relatives insisted that they would like to have her admitted to find the cause of her syncope.  Hospitalist Service was called for admission and at that time her history of vertebrobasilar disease was not known.  The patient is demented and unable to give any clear history.  Her guardian does not know the details of her medical issues and most of her medical history is clean from review of medical records.  Her daughter insists  that her code status is a full code.  There has been no history of fever, cough, or cold.  No chest pains or shortness of breath.  Unable to get a history of palpitations.  She has been eating well.  She has chronic urinary incontinence and wears diapers.  She ambulates with the assistance of a walker.  PAST MEDICAL HISTORY: 1. Advanced dementia. 2. Dyslipidemia. 3. Hypertension. 4. History of urinary tract infection. 5. Hyponatremia. 6. Thrombocytopenia. 7. History of hip fractures and ankle fractures. 8. Gait abnormality. 9. Hypothyroidism. 10.Vertebrobasilar artery syndrome and recurrent syncope and collapse.  MEDICATIONS:  Synthroid, Namenda, Aricept, calcium, magnesium, Vesicare, and omeprazole.  ALLERGIES:  No known drug allergies.  SOCIAL HISTORY:  No tobacco, alcohol, or illicit drug use.  Living condition is as noted above.  FAMILY HISTORY:  Unknown.  PHYSICAL EXAMINATION:  GENERAL:  Pleasantly confused elderly Caucasian African American lady reclining in the stretcher. VITAL SIGNS:  Temperature is 97.7, her pulse is 84, respiration 18, blood pressure 137/48.  She is saturating at 96% on room air. HEENT:  Pupils are round and equal.  Mucous membranes pink.  Anicteric. No cervical lymphadenopathy or thyromegaly or carotid bruit. CHEST:  Clear to auscultation bilaterally. CARDIOVASCULAR:  Regular rhythm.  No murmur. ABDOMEN:  Soft, nontender.  No masses. EXTREMITIES:  Without edema.  She has arthritic deformities of the shoulders and knees and ankles. CENTRAL NERVOUS SYSTEM:  Cranial nerves II through XII are grossly intact.  She has no focal lateralizing signs.  LABORATORY DATA:  Her white count is 5.8, hemoglobin 10.7, MCV 70.1, platelets 207.  She has a normal differential.  Her sodium is 136, potassium 4.2, chloride 99, CO2 of 28, glucose 108, BUN 22, creatinine 1.31, calcium 9.7.  Chest x-ray shows no acute cardiopulmonary abnormality.  EKG shows  first-degree AV block, nonspecific intraventricular conduction delay, left axis deviation, and nonspecific ST-wave changes.  ASSESSMENT: 1. Recurrent syncope. 2. History of known vertebrobasilar artery syndrome. 3. Advanced dementia. 4. Hypertension. 5. Dehydration. 6. Gait abnormality.  PLAN:  We will bring this lady in on observation for hydration and cardiac monitoring.  If no acute cause of her prolonged history of syncope is found, we will explain to her daughter how to manage vertebrobasilar artery syndrome as best as we can.  Other plans as per orders.     Vania Rea, M.D.     LC/MEDQ  D:  03/23/2011  T:  03/23/2011  Job:  [id number]  cc:   Corrie Mckusick, M.D. Fax: [phone]  Electronically Signed by Vania Rea M.D. on 03/24/2011 10:47:54 PM

## 2011-03-28 NOTE — Discharge Summary (Signed)
[name], [name]                 ACCOUNT NO.:  [id number]  MEDICAL RECORD NO.:  [id number]           PATIENT TYPE:  I  LOCATION:  A209                          FACILITY:  [facility]  PHYSICIAN:  Elliot Cousin, M.D.    DATE OF BIRTH:  06-Jul-1917  DATE OF ADMISSION:  03/22/2011 DATE OF DISCHARGE:  05/23/2012LH                              DISCHARGE SUMMARY   DISCHARGE DIAGNOSES: 1. Syncope, likely secondary to vertebrobasilar syndrome with     associated vasovagal effects. 2. Severe cerebrovascular disease per MRA of the head and neck in     December 2009. 3. Previous strokes per MRI of the brain in December 2009. 4. Bilateral carotid artery stenosis per carotid ultrasound on Mar 24, 2011.  The right carotid artery had intimal thickening, a     combination of calcified and noncalcified plaques, estimated at     less than 50% diameter stenosis and the left carotid artery was     estimated to have 50-69% stenosis. 5. Advanced dementia with delirium. 6. Hypothyroidism.  The patient's TSH and free T4 were within normal     limits at 2.38 and 1.33 respectively. 7. Mild hypertension for age. 8. Mild normocytic anemia. 9  Acute renal insufficiency secondary to volume depletion, resolved.  DISCHARGE MEDICATIONS: 1. Aricept 10 mg daily. 2. Calcium carbonate 500 mg daily. 3. Magnesium oxide 400 mg daily. 4. Namenda 5 mg b.i.d. 5. Prilosec 20 mg daily. 6. Synthroid 50 mcg daily. 7. Stop VESIcare.  DISCHARGE DISPOSITION:  The patient was discharged home in improved and stable condition on Mar 24, 2011.  She will follow up with Dr. Phillips Odor on April 02, 2011, at 3:45 p.m.  CONSULTATIONS:  None.  PROCEDURE PERFORMED: 1. Carotid ultrasound on Mar 24, 2011.  The results were dictated     above. 2. Chest x-ray on Mar 22, 2011.  The results revealed no acute     cardiopulmonary findings.  HISTORY OF PRESENTING ILLNESS:  The patient is a 77 year old woman with a past medical history  significant for advanced dementia, previous strokes, and severe cerebrovascular disease.  She presented to the emergency department on Mar 22, 2011, with a report that she passed out at home.  When she was initially evaluated, she was noted to be hemodynamically stable and afebrile.  Her EKG revealed normal sinus rhythm with a first-degree AV block, left bundle-branch block, and a heart rate of 85 beats per minute.  Her lab data were significant for hemoglobin of 10.7, 7-10 urine rbc's, and a creatinine of 1.31.  She was admitted for further evaluation and management.  HOSPITAL COURSE:  The patient was started empirically on IV fluids for treatment of mild acute renal failure.  Most of her chronic medications were restarted.  For further evaluation, a number of studies were ordered.  All of her cardiac enzymes were well within normal limits.  Her TSH and her free T4 were within normal limits.  Her urine culture was negative.  Her carotid ultrasound revealed less than 50% stenosis of the right and 50-69% diameter stenosis of the left internal  carotid artery.  The patient was maintained on Aggrenox.  Orthostatic vital signs were ordered.  She was not orthostatic by the measures taken.  The patient has advanced dementia.  She became agitated. She was redirected successfully.  Vesicare was discontinued because its CNS side effects, not because of agitaion.  The patient was asymptomatic with regard to syncope.  She was evaluated by the physical therapist who believed that she was at her functional baseline and that no further therapy was needed.  The patient does have 24-hour caretakers at home.  In addition to discontinuing VESIcare, the patient's daughter, Ms. Durant was instructed to avoid dehydration in this patient.  She was also instructed to help her to avoid standing too quickly. She voiced understanding.  The patient's creatinine improved to 1.06 prior to discharge.  She was  alert, talkative but disoriented prior to discharge.     Elliot Cousin, M.D.     DF/MEDQ  D:  03/24/2011  T:  03/25/2011  Job:  [id number]  cc:   Corrie Mckusick, M.D. Fax: [phone]  Electronically Signed by Elliot Cousin M.D. on 03/28/2011 05:23:51 PM

## 2011-08-06 LAB — CBC
HCT: 29.8 % — ABNORMAL LOW (ref 36.0–46.0)
HCT: 30.7 % — ABNORMAL LOW (ref 36.0–46.0)
HCT: 33.5 % — ABNORMAL LOW (ref 36.0–46.0)
HCT: 33.6 % — ABNORMAL LOW (ref 36.0–46.0)
Hemoglobin: 10.1 g/dL — ABNORMAL LOW (ref 12.0–15.0)
Hemoglobin: 10.8 g/dL — ABNORMAL LOW (ref 12.0–15.0)
Hemoglobin: 10.9 g/dL — ABNORMAL LOW (ref 12.0–15.0)
MCHC: 31.4 g/dL (ref 30.0–36.0)
MCHC: 31.6 g/dL (ref 30.0–36.0)
MCHC: 31.7 g/dL (ref 30.0–36.0)
MCV: 71.3 fL — ABNORMAL LOW (ref 78.0–100.0)
MCV: 71.4 fL — ABNORMAL LOW (ref 78.0–100.0)
Platelets: 191 10*3/uL (ref 150–400)
Platelets: 272 10*3/uL (ref 150–400)
RBC: 4.43 MIL/uL (ref 3.87–5.11)
RBC: 4.8 MIL/uL (ref 3.87–5.11)
RBC: 4.82 MIL/uL (ref 3.87–5.11)
RBC: 5.08 MIL/uL (ref 3.87–5.11)
RDW: 15.9 % — ABNORMAL HIGH (ref 11.5–15.5)
RDW: 15.9 % — ABNORMAL HIGH (ref 11.5–15.5)
RDW: 16 % — ABNORMAL HIGH (ref 11.5–15.5)
RDW: 16 % — ABNORMAL HIGH (ref 11.5–15.5)
RDW: 16.1 % — ABNORMAL HIGH (ref 11.5–15.5)
RDW: 16.2 % — ABNORMAL HIGH (ref 11.5–15.5)

## 2011-08-06 LAB — BASIC METABOLIC PANEL
BUN: 13 mg/dL (ref 6–23)
BUN: 14 mg/dL (ref 6–23)
BUN: 15 mg/dL (ref 6–23)
CO2: 23 mEq/L (ref 19–32)
CO2: 26 mEq/L (ref 19–32)
Calcium: 8.4 mg/dL (ref 8.4–10.5)
Calcium: 8.6 mg/dL (ref 8.4–10.5)
Chloride: 103 mEq/L (ref 96–112)
Chloride: 103 mEq/L (ref 96–112)
Creatinine, Ser: 0.94 mg/dL (ref 0.4–1.2)
GFR calc Af Amer: >60 mL/min (ref >60)
GFR calc Af Amer: >60 mL/min (ref >60)
GFR calc non Af Amer: 45 mL/min — ABNORMAL LOW (ref >60)
GFR calc non Af Amer: 49 mL/min — ABNORMAL LOW (ref >60)
GFR calc non Af Amer: 50 mL/min — ABNORMAL LOW (ref >60)
GFR calc non Af Amer: 52 mL/min — ABNORMAL LOW (ref >60)
Glucose, Bld: 100 mg/dL — ABNORMAL HIGH (ref 70–99)
Glucose, Bld: 102 mg/dL — ABNORMAL HIGH (ref 70–99)
Glucose, Bld: 115 mg/dL — ABNORMAL HIGH (ref 70–99)
Glucose, Bld: 118 mg/dL — ABNORMAL HIGH (ref 70–99)
Glucose, Bld: 126 mg/dL — ABNORMAL HIGH (ref 70–99)
Potassium: 3.3 mEq/L — ABNORMAL LOW (ref 3.5–5.1)
Potassium: 4.1 mEq/L (ref 3.5–5.1)
Potassium: 4.4 mEq/L (ref 3.5–5.1)
Sodium: 136 mEq/L (ref 135–145)
Sodium: 136 mEq/L (ref 135–145)
Sodium: 137 mEq/L (ref 135–145)

## 2011-08-06 LAB — COMPREHENSIVE METABOLIC PANEL
AST: 29 U/L (ref 0–37)
Albumin: 2.6 g/dL — ABNORMAL LOW (ref 3.5–5.2)
Albumin: 3.1 g/dL — ABNORMAL LOW (ref 3.5–5.2)
Albumin: 3.5 g/dL (ref 3.5–5.2)
Alkaline Phosphatase: 84 U/L (ref 39–117)
BUN: 12 mg/dL (ref 6–23)
BUN: 12 mg/dL (ref 6–23)
BUN: 20 mg/dL (ref 6–23)
Calcium: 9.1 mg/dL (ref 8.4–10.5)
Calcium: 9.3 mg/dL (ref 8.4–10.5)
Creatinine, Ser: 0.99 mg/dL (ref 0.4–1.2)
Creatinine, Ser: 1.04 mg/dL (ref 0.4–1.2)
GFR calc Af Amer: >60 mL/min (ref >60)
Glucose, Bld: 102 mg/dL — ABNORMAL HIGH (ref 70–99)
Potassium: 3.4 mEq/L — ABNORMAL LOW (ref 3.5–5.1)
Total Protein: 6 g/dL (ref 6.0–8.3)
Total Protein: 6.6 g/dL (ref 6.0–8.3)
Total Protein: 6.6 g/dL (ref 6.0–8.3)

## 2011-08-06 LAB — URINALYSIS, ROUTINE W REFLEX MICROSCOPIC
Bilirubin Urine: NEGATIVE
Bilirubin Urine: NEGATIVE
Ketones, ur: NEGATIVE mg/dL
Nitrite: NEGATIVE
Nitrite: NEGATIVE
Specific Gravity, Urine: 1.01 (ref 1.005–1.030)
Urobilinogen, UA: 0.2 mg/dL (ref 0.0–1.0)
pH: 8.5 — ABNORMAL HIGH (ref 5.0–8.0)

## 2011-08-06 LAB — CK TOTAL AND CKMB (NOT AT ARMC)
CK, MB: 1.2 ng/mL (ref 0.3–4.0)
Relative Index: 1 (ref 0.0–2.5)
Relative Index: INVALID (ref 0.0–2.5)
Total CK: 119 U/L (ref 7–177)

## 2011-08-06 LAB — TROPONIN I: Troponin I: 0.02 ng/mL (ref 0.00–0.06)

## 2011-08-06 LAB — RPR: RPR Ser Ql: NONREACTIVE

## 2011-08-06 LAB — LIPID PANEL
LDL Cholesterol: 118 mg/dL — ABNORMAL HIGH (ref 0–99)
Total CHOL/HDL Ratio: 4.7 RATIO
Triglycerides: 81 mg/dL (ref <150)
VLDL: 16 mg/dL (ref 0–40)

## 2011-08-06 LAB — CARDIAC PANEL(CRET KIN+CKTOT+MB+TROPI)
CK, MB: 2.4 ng/mL (ref 0.3–4.0)
CK, MB: 4.1 ng/mL — ABNORMAL HIGH (ref 0.3–4.0)
Relative Index: 1.2 (ref 0.0–2.5)
Relative Index: 1.7 (ref 0.0–2.5)
Total CK: 241 U/L — ABNORMAL HIGH (ref 7–177)
Total CK: 248 U/L — ABNORMAL HIGH (ref 7–177)
Troponin I: <0.01 ng/mL (ref 0.00–0.06)
Troponin I: <0.01 ng/mL (ref 0.00–0.06)

## 2011-08-06 LAB — APTT: aPTT: 22 seconds — ABNORMAL LOW (ref 24–37)

## 2011-08-06 LAB — DIFFERENTIAL
Basophils Absolute: 0 10*3/uL (ref 0.0–0.1)
Lymphs Abs: 0.9 10*3/uL (ref 0.7–4.0)
Monocytes Absolute: 0.7 10*3/uL (ref 0.1–1.0)
Neutro Abs: 7.1 10*3/uL (ref 1.7–7.7)
Neutrophils Relative %: 79 % — ABNORMAL HIGH (ref 43–77)

## 2011-08-06 LAB — URINE CULTURE
Colony Count: NO GROWTH
Culture: NO GROWTH

## 2011-08-06 LAB — URINE MICROSCOPIC-ADD ON

## 2011-08-06 LAB — IRON AND TIBC
Iron: 61 ug/dL (ref 42–135)
UIBC: 336 ug/dL

## 2011-08-06 LAB — PROTIME-INR: Prothrombin Time: 13.7 seconds (ref 11.6–15.2)

## 2011-08-06 LAB — VITAMIN B12: Vitamin B-12: 1040 pg/mL — ABNORMAL HIGH (ref 211–911)

## 2011-08-06 LAB — FOLATE: Folate: >20 ng/mL

## 2011-08-10 LAB — COMPREHENSIVE METABOLIC PANEL
ALT: 14
CO2: 32
Calcium: 9.1
Chloride: 104
Creatinine, Ser: 0.87
GFR calc non Af Amer: >60
Glucose, Bld: 103 — ABNORMAL HIGH
Total Bilirubin: 0.5

## 2011-08-10 LAB — DIFFERENTIAL
Basophils Absolute: 0
Eosinophils Relative: 2
Monocytes Absolute: 0.5
Neutrophils Relative %: 79 — ABNORMAL HIGH

## 2011-08-10 LAB — CBC
Hemoglobin: 11.6 — ABNORMAL LOW
MCHC: 32.2
MCV: 69.8 — ABNORMAL LOW
RBC: 5.17 — ABNORMAL HIGH

## 2013-08-05 ENCOUNTER — Encounter (HOSPITAL_COMMUNITY): Payer: Self-pay | Admitting: *Deleted

## 2013-08-05 ENCOUNTER — Other Ambulatory Visit: Payer: Self-pay

## 2013-08-05 ENCOUNTER — Emergency Department (HOSPITAL_COMMUNITY)
Admission: EM | Admit: 2013-08-05 | Discharge: 2013-08-05 | Disposition: A | Discharge status: Home / Self Care | Payer: Medicare Other | Attending: Emergency Medicine | Admitting: Emergency Medicine

## 2013-08-05 DIAGNOSIS — G309 Alzheimer's disease, unspecified: Secondary | ICD-10-CM | POA: Insufficient documentation

## 2013-08-05 DIAGNOSIS — R55 Syncope and collapse: Secondary | ICD-10-CM | POA: Insufficient documentation

## 2013-08-05 DIAGNOSIS — F0280 Dementia in other diseases classified elsewhere without behavioral disturbance: Secondary | ICD-10-CM | POA: Insufficient documentation

## 2013-08-05 DIAGNOSIS — R1110 Vomiting, unspecified: Secondary | ICD-10-CM | POA: Insufficient documentation

## 2013-08-05 HISTORY — DX: Alzheimer's disease, unspecified: G30.9

## 2013-08-05 HISTORY — DX: Dementia in other diseases classified elsewhere, unspecified severity, without behavioral disturbance, psychotic disturbance, mood disturbance, and anxiety: F02.80

## 2013-08-05 LAB — CBC WITH DIFFERENTIAL/PLATELET
Basophils Absolute: 0 10*3/uL (ref 0.0–0.1)
Basophils Relative: 0 % (ref 0–1)
HCT: 35.3 % — ABNORMAL LOW (ref 36.0–46.0)
Lymphocytes Relative: 6 % — ABNORMAL LOW (ref 12–46)
MCHC: 31.4 g/dL (ref 30.0–36.0)
Neutro Abs: 8.1 10*3/uL — ABNORMAL HIGH (ref 1.7–7.7)
Neutrophils Relative %: 88 % — ABNORMAL HIGH (ref 43–77)
Platelets: 202 10*3/uL (ref 150–400)
RDW: 16.9 % — ABNORMAL HIGH (ref 11.5–15.5)
WBC: 9.3 10*3/uL (ref 4.0–10.5)

## 2013-08-05 LAB — BASIC METABOLIC PANEL
BUN: 19 mg/dL (ref 6–23)
Calcium: 9.3 mg/dL (ref 8.4–10.5)
Chloride: 101 mEq/L (ref 96–112)
Creatinine, Ser: 1.08 mg/dL (ref 0.50–1.10)
GFR calc Af Amer: 49 mL/min — ABNORMAL LOW (ref >90)
GFR calc non Af Amer: 43 mL/min — ABNORMAL LOW (ref >90)

## 2013-08-05 NOTE — ED Provider Notes (Signed)
CSN: [id number]     Arrival date & time 08/05/13  1413 History   This chart was scribed for Donnetta Hutching, MD by Carl Best, ED Scribe. This patient was seen in room APA18/APA18 and the patient's care was started at 3:25 PM.      Chief Complaint  Patient presents with  . Near Syncope  . Emesis   LEVEL 5 CAVEAT: Patient is demented.  History provided by her sitter and friend. Patient is a 79 y.o. female presenting with vomiting. The history is provided by a caregiver and a friend. No language interpreter was used.  Emesis  HPI Comments: [name] is a 79 y.o. female brought in by her sitter and who presents to the Emergency Department complaining of a near syncopal episode that started today.  The patient's sitter states that the patient was walking with her walker and she "went down".  She states that she was able to catch the patient and sat her down in a chair where she had an episode of emesis and was unresponsive.  Per the sitter, the syncopal episode lasted for five minutes and the patient became responsive once again.  She states that the patient experienced another syncopal episode upon the arrival of the paramedics.  Since arriving at the hospital, the patient is back to baseline behavior.  The patient's friend states that the patient has had a history of syncopal episodes 3 years ago when the patient had a cholecystectomy.  Per the sitter, the patient has 3 sitters that supervise her 24 hours a day.   Past Medical History  Diagnosis Date  . Alzheimer disease    Past Surgical History  Procedure Laterality Date  . Hip surgery Right    History reviewed. No pertinent family history. History  Substance Use Topics  . Smoking status: Never Smoker   . Smokeless tobacco: Not on file  . Alcohol Use: Yes   OB History   Grav Para Term Preterm Abortions TAB SAB Ect Mult Living                 Review of Systems  Unable to perform ROS: Dementia    Allergies  Review of patient's  allergies indicates no known allergies.  Home Medications  No current outpatient prescriptions on file.  Triage Vitals: BP 171/60  Pulse 70  Temp(Src) 96.2 F (35.7 C) (Axillary)  SpO2 96%  Physical Exam  Nursing note and vitals reviewed. Constitutional:  No acute distress.    HENT:  Head: Normocephalic and atraumatic.  Eyes: Conjunctivae and EOM are normal. Pupils are equal, round, and reactive to light.  Neck: Normal range of motion. Neck supple.  Cardiovascular: Normal rate, regular rhythm and normal heart sounds.   Pulmonary/Chest: Effort normal and breath sounds normal.  Abdominal: Soft. Bowel sounds are normal.  Musculoskeletal: Normal range of motion.  Neurological:  Demented.    Skin: Skin is warm and dry.  Psychiatric:  Demented.      ED Course  Procedures (including critical care time)  DIAGNOSTIC STUDIES: Oxygen Saturation is 96% on room air, adequate by my interpretation.    COORDINATION OF CARE: 3:28 PM- Discussed obtaining an EKG and blood work with the sitter and the friend.  Discussed clinical suspicion of a temporary drop in blood pressure and discharging the patient if she remains at baseline behavior.  The patient and the sitter agreed to the treatment plan.     Labs Review Labs Reviewed  BASIC METABOLIC PANEL -  Abnormal; Notable for the following:    Glucose, Bld 131 (*)    GFR calc non Af Amer 43 (*)    GFR calc Af Amer 49 (*)    All other components within normal limits  CBC WITH DIFFERENTIAL - Abnormal; Notable for the following:    Hemoglobin 11.1 (*)    HCT 35.3 (*)    MCV 70.6 (*)    MCH 22.2 (*)    RDW 16.9 (*)    Neutrophils Relative % 88 (*)    Neutro Abs 8.1 (*)    Lymphocytes Relative 6 (*)    Lymphs Abs 0.5 (*)    All other components within normal limits   Imaging Review No results found.    Date: 08/05/2013  Rate: 72  Rhythm: normal sinus rhythm  QRS Axis: left  Intervals: normal  ST/T Wave abnormalities: normal   Conduction Disutrbances: LBBB  Narrative Interpretation: unremarkable    MDM  No diagnosis found. Patient is back to baseline behavior according to the sitter and other caregiver.  She has primary care follow up  I personally performed the services described in this documentation, which was scribed in my presence. The recorded information has been reviewed and is accurate.    Donnetta Hutching, MD 08/05/13 [phone]

## 2013-08-05 NOTE — ED Notes (Signed)
[facility] here to transport pt to residence,

## 2013-08-05 NOTE — ED Notes (Signed)
Dr. Adriana Simas in prior to RN, see edp assessment for further,

## 2013-08-05 NOTE — ED Notes (Signed)
Pt family expressed understanding of discharge instructions,

## 2013-08-05 NOTE — ED Notes (Signed)
Pt brought in by [facility] with c/o near syncope and vomiting; pt has sitters that stay with her 24/7 and one sitter stated while her and pt were walking around, the pt stopped and seemed to be falling; pt was assisted to chair and pt began vomiting.  Pt has hx of alzheimers; pt is oriented to person only

## 2014-05-02 ENCOUNTER — Ambulatory Visit (HOSPITAL_COMMUNITY)
Admission: RE | Admit: 2014-05-02 | Discharge: 2014-05-02 | Disposition: A | Discharge status: Home / Self Care | Payer: Medicare Other | Source: Ambulatory Visit | Attending: Family Medicine | Admitting: Family Medicine

## 2014-05-02 ENCOUNTER — Other Ambulatory Visit (HOSPITAL_COMMUNITY): Payer: Self-pay | Admitting: Family Medicine

## 2014-05-02 ENCOUNTER — Encounter (INDEPENDENT_AMBULATORY_CARE_PROVIDER_SITE_OTHER): Payer: Self-pay

## 2014-05-02 DIAGNOSIS — M25569 Pain in unspecified knee: Secondary | ICD-10-CM | POA: Insufficient documentation

## 2014-05-02 DIAGNOSIS — M25561 Pain in right knee: Secondary | ICD-10-CM

## 2014-06-27 ENCOUNTER — Ambulatory Visit: Payer: Medicare Other | Admitting: Orthopedic Surgery

## 2014-07-06 ENCOUNTER — Emergency Department (HOSPITAL_COMMUNITY)
Admission: EM | Admit: 2014-07-06 | Discharge: 2014-07-06 | Disposition: A | Discharge status: Home / Self Care | Payer: Medicare Other | Attending: Emergency Medicine | Admitting: Emergency Medicine

## 2014-07-06 ENCOUNTER — Encounter (HOSPITAL_COMMUNITY): Payer: Self-pay | Admitting: Emergency Medicine

## 2014-07-06 ENCOUNTER — Emergency Department (HOSPITAL_COMMUNITY): Payer: Medicare Other

## 2014-07-06 DIAGNOSIS — R0602 Shortness of breath: Secondary | ICD-10-CM | POA: Insufficient documentation

## 2014-07-06 DIAGNOSIS — D509 Iron deficiency anemia, unspecified: Secondary | ICD-10-CM | POA: Diagnosis not present

## 2014-07-06 DIAGNOSIS — I509 Heart failure, unspecified: Secondary | ICD-10-CM | POA: Diagnosis not present

## 2014-07-06 DIAGNOSIS — N289 Disorder of kidney and ureter, unspecified: Secondary | ICD-10-CM | POA: Diagnosis not present

## 2014-07-06 DIAGNOSIS — F0280 Dementia in other diseases classified elsewhere without behavioral disturbance: Secondary | ICD-10-CM | POA: Insufficient documentation

## 2014-07-06 DIAGNOSIS — Z79899 Other long term (current) drug therapy: Secondary | ICD-10-CM | POA: Diagnosis not present

## 2014-07-06 DIAGNOSIS — G309 Alzheimer's disease, unspecified: Secondary | ICD-10-CM | POA: Diagnosis not present

## 2014-07-06 LAB — PRO B NATRIURETIC PEPTIDE: PRO B NATRI PEPTIDE: 41304 pg/mL — AB (ref 0–450)

## 2014-07-06 LAB — CBC WITH DIFFERENTIAL/PLATELET
BASOS ABS: 0 10*3/uL (ref 0.0–0.1)
Basophils Relative: 0 % (ref 0–1)
EOS ABS: 0 10*3/uL (ref 0.0–0.7)
EOS PCT: 0 % (ref 0–5)
HCT: 30.9 % — ABNORMAL LOW (ref 36.0–46.0)
Hemoglobin: 9.8 g/dL — ABNORMAL LOW (ref 12.0–15.0)
LYMPHS ABS: 0.7 10*3/uL (ref 0.7–4.0)
Lymphocytes Relative: 7 % — ABNORMAL LOW (ref 12–46)
MCH: 21.9 pg — AB (ref 26.0–34.0)
MCHC: 31.7 g/dL (ref 30.0–36.0)
MCV: 69.1 fL — AB (ref 78.0–100.0)
Monocytes Absolute: 0.6 10*3/uL (ref 0.1–1.0)
Monocytes Relative: 5 % (ref 3–12)
Neutro Abs: 9 10*3/uL — ABNORMAL HIGH (ref 1.7–7.7)
Neutrophils Relative %: 87 % — ABNORMAL HIGH (ref 43–77)
PLATELETS: 265 10*3/uL (ref 150–400)
RBC: 4.47 MIL/uL (ref 3.87–5.11)
RDW: 16.1 % — AB (ref 11.5–15.5)
WBC: 10.4 10*3/uL (ref 4.0–10.5)

## 2014-07-06 LAB — BASIC METABOLIC PANEL
Anion gap: 14 (ref 5–15)
BUN: 28 mg/dL — ABNORMAL HIGH (ref 6–23)
CALCIUM: 8.2 mg/dL — AB (ref 8.4–10.5)
CO2: 25 mEq/L (ref 19–32)
Chloride: 104 mEq/L (ref 96–112)
Creatinine, Ser: 1.24 mg/dL — ABNORMAL HIGH (ref 0.50–1.10)
GFR calc Af Amer: 41 mL/min — ABNORMAL LOW (ref >90)
GFR, EST NON AFRICAN AMERICAN: 36 mL/min — AB (ref >90)
GLUCOSE: 144 mg/dL — AB (ref 70–99)
Potassium: 4.2 mEq/L (ref 3.7–5.3)
SODIUM: 143 meq/L (ref 137–147)

## 2014-07-06 LAB — TROPONIN I: Troponin I: <0.3 ng/mL (ref <0.30)

## 2014-07-06 MED ORDER — FUROSEMIDE 40 MG PO TABS: 40.0000 mg | ORAL_TABLET | Freq: Every day | ORAL | Status: DC

## 2014-07-06 MED ORDER — IPRATROPIUM-ALBUTEROL 0.5-2.5 (3) MG/3ML IN SOLN
3.0000 mL | Freq: Once | RESPIRATORY_TRACT | Status: AC
  Administered 2014-07-06: 3 mL via RESPIRATORY_TRACT
  Filled 2014-07-06: qty 3

## 2014-07-06 MED ORDER — FUROSEMIDE 10 MG/ML IJ SOLN
40.0000 mg | Freq: Once | INTRAMUSCULAR | Status: AC
  Administered 2014-07-06: 40 mg via INTRAVENOUS
  Filled 2014-07-06: qty 4

## 2014-07-06 NOTE — ED Notes (Signed)
[facility] called for transport back home. 

## 2014-07-06 NOTE — ED Notes (Signed)
[facility] called for transport back home.

## 2014-07-06 NOTE — ED Notes (Signed)
Dr Le at bedside.  

## 2014-07-06 NOTE — ED Notes (Signed)
Dr. Glick at bedside.  

## 2014-07-06 NOTE — ED Notes (Signed)
Pt has been sob per caregiver.

## 2014-07-06 NOTE — Consult Note (Signed)
[facility] History and Physical  [name] ZOX:[id number] DOB: 1917-01-31    PCP:   Cassell Smiles., MD   Chief Complaint: Shortness of breath.  HPI: [name] is an 80 y.o. female with advanced dementia, hx of hypothyroidism, brought to the ER via EMS with her care giver, as she appears to be short of breath these past few days.  She has no productive coughs, and in the ER, she had no respiratory difficulty.  She was agitated with plebotomy and IV placement.  Her pulse oximetry has been 98 percent.  Evalatuion in the ER included a CXR showing interstitial edema and bilateral pleural effusion.  Her Cr was 1.24 and she has no leukocytosis, with no fever, and Hb of 9.8g per dL, EKG showed no ischemic changes, and BNP of 41K.  She was given neb tx and IV Lasix of .  Hospitalist was asked to consult for admission.    Rewiew of Systems: Unable.   Past Medical History  Diagnosis Date  . Alzheimer disease     Past Surgical History  Procedure Laterality Date  . Hip surgery Right     Medications:  HOME MEDS: Prior to Admission medications   Medication Sig Start Date End Date Taking? Authorizing Provider  calcium carbonate (OS-CAL) 600 MG TABS tablet Take 600 mg by mouth every morning.    Historical Provider, MD  donepezil (ARICEPT) 10 MG tablet Take 1 tablet by mouth at bedtime.  07/12/13   Historical Provider, MD  levothyroxine (SYNTHROID, LEVOTHROID) 50 MCG tablet Take 1 tablet by mouth every morning.  07/12/13   Historical Provider, MD  magnesium oxide (MAG-OX) 400 MG tablet Take 400 mg by mouth every morning.    Historical Provider, MD  NAMENDA XR 14 MG CP24 Take 1 tablet by mouth every morning.  07/12/13   Historical Provider, MD  omeprazole (PRILOSEC) 20 MG capsule Take 1 capsule by mouth every morning.  07/12/13   Historical Provider, MD     Allergies:  No Known Allergies  Social History:   reports that she has never smoked. She does not have any smokeless  tobacco history on file. She reports that she drinks alcohol. She reports that she does not use illicit drugs.  Family History: History reviewed. No pertinent family history.   Physical Exam: Filed Vitals:   07/06/14 0235 07/06/14 0309 07/06/14 0330 07/06/14 0400  BP: 109/56  113/47 109/54  Pulse: 86  35 85  Resp: SpO2: 98% 93%  96%   Blood pressure 109/54, pulse 85, resp. rate 28, SpO2 96.00%.  GEN:  Pleasant  patient lying in the stretcher in no acute distress; cooperative with exam. PSYCH:  alert and oriented x4; does not appear anxious or depressed; affect is appropriate. HEENT: Mucous membranes pink and anicteric; PERRLA; EOM intact; no cervical lymphadenopathy nor thyromegaly or carotid bruit; no JVD; There were no stridor. Neck is very supple. Breasts:: Not examined CHEST WALL: No tenderness CHEST: Normal respiration, slight bibisilar rales. HEART: Regular rate and rhythm.  There are no murmur, rub, or gallops.   BACK: No kyphosis or scoliosis; no CVA tenderness ABDOMEN: soft and non-tender; no masses, no organomegaly, normal abdominal bowel sounds; no pannus; no intertriginous candida. There is no rebound and no distention. Rectal Exam: Not done EXTREMITIES: No bone or joint deformity; age-appropriate arthropathy of the hands and knees; no edema; no ulcerations.  There is no calf tenderness. Genitalia: not examined PULSES: 2+ and symmetric  SKIN: Normal hydration no rash or ulceration CNS: Cranial nerves 2-12 grossly intact no focal lateralizing neurologic deficit.  Speech is fluent; uvula elevated with phonation, facial symmetry and tongue midline. DTR are normal bilaterally, cerebella exam is intact, barbinski is negative and strengths are equaled bilaterally.  No sensory loss.   Labs on Admission:  Basic Metabolic Panel:  Recent Labs Lab 07/06/14 0245  NA 143  K 4.2  CL 104  CO2 25  GLUCOSE 144*  BUN 28*  CREATININE 1.24*  CALCIUM 8.2*   Liver  Function Tests: No results found for this basename: AST, ALT, ALKPHOS, BILITOT, PROT, ALBUMIN,  in the last 168 hours No results found for this basename: LIPASE, AMYLASE,  in the last 168 hours No results found for this basename: AMMONIA,  in the last 168 hours CBC:  Recent Labs Lab 07/06/14 0245  WBC 10.4  NEUTROABS 9.0*  HGB 9.8*  HCT 30.9*  MCV 69.1*  PLT 265   Cardiac Enzymes:  Recent Labs Lab 07/06/14 0245  TROPONINI <0.30    CBG: No results found for this basename: GLUCAP,  in the last 168 hours   Radiological Exams on Admission: Dg Chest Port 1 View  07/06/2014   CLINICAL DATA:  Dyspnea  EXAM: PORTABLE CHEST - 1 VIEW  COMPARISON:  03/22/2011  FINDINGS: Cardiac enlargement with interval development of perihilar airspace disease likely representing edema. Bilateral pleural effusions greater on the right. No pneumothorax. Calcified aorta.  IMPRESSION: Cardiac enlargement with interval development of perihilar airspace disease, likely edema, and bilateral pleural effusions.   Electronically Signed   By: Burman Nieves M.D.   On: 07/06/2014 02:54    EKG: Independently reviewed. NSR with no ischemic changes.   Assessment/Plan  CHF, new onset. Advanced dementia  PLAN:  I think given her mild symptoms, and advanced dementia with advanced age, she is likely not going to benefit getting invasive testing such as a cardiac cath, nor further ischemic work up.  I would recommend treat her with IV Lasix in the ER and give oral Lasix and discharge her home to have follow up with her PCP next week.  She consistently maintained her sat at 97 percent on no supplemental oxygen.  I spoke with her guardian Ms Eula Fried to update her, and she agreed.  If she continues to have SOB, then she should be brought back to the ER.  Thank you for the consultation.  Other plans as per orders.  Code Status: FULL Unk Lightning, MD. [facility] Pager [phone] 7pm to 7am.  07/06/2014,  4:34 AM

## 2014-07-06 NOTE — Discharge Instructions (Signed)
Heart Failure °Heart failure is a condition in which the heart has trouble pumping blood. This means your heart does not pump blood efficiently for your body to work well. In some cases of heart failure, fluid may back up into your lungs or you may have swelling (edema) in your lower legs. Heart failure is usually a long-term (chronic) condition. It is important for you to take good care of yourself and follow your health care provider's treatment plan. °CAUSES  °Some health conditions can cause heart failure. Those health conditions include: °· High blood pressure (hypertension). Hypertension causes the heart muscle to work harder than normal. When pressure in the blood vessels is high, the heart needs to pump (contract) with more force in order to circulate blood throughout the body. High blood pressure eventually causes the heart to become stiff and weak. °· Coronary artery disease (CAD). CAD is the buildup of cholesterol and fat (plaque) in the arteries of the heart. The blockage in the arteries deprives the heart muscle of oxygen and blood. This can cause chest pain and may lead to a heart attack. High blood pressure can also contribute to CAD. °· Heart attack (myocardial infarction). A heart attack occurs when one or more arteries in the heart become blocked. The loss of oxygen damages the muscle tissue of the heart. When this happens, part of the heart muscle dies. The injured tissue does not contract as well and weakens the heart's ability to pump blood. °· Abnormal heart valves. When the heart valves do not open and close properly, it can cause heart failure. This makes the heart muscle pump harder to keep the blood flowing. °· Heart muscle disease (cardiomyopathy or myocarditis). Heart muscle disease is damage to the heart muscle from a variety of causes. These can include drug or alcohol abuse, infections, or unknown reasons. These can increase the risk of heart failure. °· Lung disease. Lung disease  makes the heart work harder because the lungs do not work properly. This can cause a strain on the heart, leading it to fail. °· Diabetes. Diabetes increases the risk of heart failure. High blood sugar contributes to high fat (lipid) levels in the blood. Diabetes can also cause slow damage to tiny blood vessels that carry important nutrients to the heart muscle. When the heart does not get enough oxygen and food, it can cause the heart to become weak and stiff. This leads to a heart that does not contract efficiently. °· Other conditions can contribute to heart failure. These include abnormal heart rhythms, thyroid problems, and low blood counts (anemia). °Certain unhealthy behaviors can increase the risk of heart failure, including: °· Being overweight. °· Smoking or chewing tobacco. °· Eating foods high in fat and cholesterol. °· Abusing illicit drugs or alcohol. °· Lacking physical activity. °SYMPTOMS  °Heart failure symptoms may vary and can be hard to detect. Symptoms may include: °· Shortness of breath with activity, such as climbing stairs. °· Persistent cough. °· Swelling of the feet, ankles, legs, or abdomen. °· Unexplained weight gain. °· Difficulty breathing when lying flat (orthopnea). °· Waking from sleep because of the need to sit up and get more air. °· Rapid heartbeat. °· Fatigue and loss of energy. °· Feeling light-headed, dizzy, or close to fainting. °· Loss of appetite. °· Nausea. °· Increased urination during the night (nocturia). °DIAGNOSIS  °A diagnosis of heart failure is based on your history, symptoms, physical examination, and diagnostic tests. Diagnostic tests for heart failure may include: °·   Echocardiography. °· Electrocardiography. °· Chest X-ray. °· Blood tests. °· Exercise stress test. °· Cardiac angiography. °· Radionuclide scans. °TREATMENT  °Treatment is aimed at managing the symptoms of heart failure. Medicines, behavioral changes, or surgical intervention may be necessary to  treat heart failure. °· Medicines to help treat heart failure may include: °¨ Angiotensin-converting enzyme (ACE) inhibitors. This type of medicine blocks the effects of a blood protein called angiotensin-converting enzyme. ACE inhibitors relax (dilate) the blood vessels and help lower blood pressure. °¨ Angiotensin receptor blockers (ARBs). This type of medicine blocks the actions of a blood protein called angiotensin. Angiotensin receptor blockers dilate the blood vessels and help lower blood pressure. °¨ Water pills (diuretics). Diuretics cause the kidneys to remove salt and water from the blood. The extra fluid is removed through urination. This loss of extra fluid lowers the volume of blood the heart pumps. °¨ Beta blockers. These prevent the heart from beating too fast and improve heart muscle strength. °¨ Digitalis. This increases the force of the heartbeat. °· Healthy behavior changes include: °¨ Obtaining and maintaining a healthy weight. °¨ Stopping smoking or chewing tobacco. °¨ Eating heart-healthy foods. °¨ Limiting or avoiding alcohol. °¨ Stopping illicit drug use. °¨ Physical activity as directed by your health care provider. °· Surgical treatment for heart failure may include: °¨ A procedure to open blocked arteries, repair damaged heart valves, or remove damaged heart muscle tissue. °¨ A pacemaker to improve heart muscle function and control certain abnormal heart rhythms. °¨ An internal cardioverter defibrillator to treat certain serious abnormal heart rhythms. °¨ A left ventricular assist device (LVAD) to assist the pumping ability of the heart. °HOME CARE INSTRUCTIONS  °· Take medicines only as directed by your health care provider. Medicines are important in reducing the workload of your heart, slowing the progression of heart failure, and improving your symptoms. °¨ Do not stop taking your medicine unless directed by your health care provider. °¨ Do not skip any dose of medicine. °¨ Refill your  prescriptions before you run out of medicine. Your medicines are needed every day. °· Engage in moderate physical activity if directed by your health care provider. Moderate physical activity can benefit some people. The elderly and people with severe heart failure should consult with a health care provider for physical activity recommendations. °· Eat heart-healthy foods. Food choices should be free of trans fat and low in saturated fat, cholesterol, and salt (sodium). Healthy choices include fresh or frozen fruits and vegetables, fish, lean meats, legumes, fat-free or low-fat dairy products, and whole grain or high fiber foods. Talk to a dietitian to learn more about heart-healthy foods. °· Limit sodium if directed by your health care provider. Sodium restriction may reduce symptoms of heart failure in some people. Talk to a dietitian to learn more about heart-healthy seasonings. °· Use healthy cooking methods. Healthy cooking methods include roasting, grilling, broiling, baking, poaching, steaming, or stir-frying. Talk to a dietitian to learn more about healthy cooking methods. °· Limit fluids if directed by your health care provider. Fluid restriction may reduce symptoms of heart failure in some people. °· Weigh yourself every day. Daily weights are important in the early recognition of excess fluid. You should weigh yourself every morning after you urinate and before you eat breakfast. Wear the same amount of clothing each time you weigh yourself. Record your daily weight. Provide your health care provider with your weight record. °· Monitor and record your blood pressure if directed by your health care   provider. °· Check your pulse if directed by your health care provider. °· Lose weight if directed by your health care provider. Weight loss may reduce symptoms of heart failure in some people. °· Stop smoking or chewing tobacco. Nicotine makes your heart work harder by causing your blood vessels to constrict.  Do not use nicotine gum or patches before talking to your health care provider. °· Keep all follow-up visits as directed by your health care provider. This is important. °· Limit alcohol intake to no more than 1 drink per day for nonpregnant women and 2 drinks per day for men. One drink equals 12 ounces of beer, 5 ounces of wine, or 1½ ounces of hard liquor. Drinking more than that is harmful to your heart. Tell your health care provider if you drink alcohol several times a week. Talk with your health care provider about whether alcohol is safe for you. If your heart has already been damaged by alcohol or you have severe heart failure, drinking alcohol should be stopped completely. °· Stop illicit drug use. °· Stay up-to-date with immunizations. It is especially important to prevent respiratory infections through current pneumococcal and influenza immunizations. °· Manage other health conditions such as hypertension, diabetes, thyroid disease, or abnormal heart rhythms as directed by your health care provider. °· Learn to manage stress. °· Plan rest periods when fatigued. °· Learn strategies to manage high temperatures. If the weather is extremely hot: °¨ Avoid vigorous physical activity. °¨ Use air conditioning or fans or seek a cooler location. °¨ Avoid caffeine and alcohol. °¨ Wear loose-fitting, lightweight, and light-colored clothing. °· Learn strategies to manage cold temperatures. If the weather is extremely cold: °¨ Avoid vigorous physical activity. °¨ Layer clothes. °¨ Wear mittens or gloves, a hat, and a scarf when going outside. °¨ Avoid alcohol. °· Obtain ongoing education and support as needed. °· Participate in or seek rehabilitation as needed to maintain or improve independence and quality of life. °SEEK MEDICAL CARE IF:  °· Your weight increases by 03 lb/1.4 kg in 1 day or 05 lb/2.3 kg in a week. °· You have increasing shortness of breath that is unusual for you. °· You are unable to participate in  your usual physical activities. °· You tire easily. °· You cough more than normal, especially with physical activity. °· You have any or more swelling in areas such as your hands, feet, ankles, or abdomen. °· You are unable to sleep because it is hard to breathe. °· You feel like your heart is beating fast (palpitations). °· You become dizzy or light-headed upon standing up. °SEEK IMMEDIATE MEDICAL CARE IF:  °· You have difficulty breathing. °· There is a change in mental status such as decreased alertness or difficulty with concentration. °· You have a pain or discomfort in your chest. °· You have an episode of fainting (syncope). °MAKE SURE YOU:  °· Understand these instructions. °· Will watch your condition. °· Will get help right away if you are not doing well or get worse. °Document Released: 10/18/2005 Document Revised: 03/04/2014 Document Reviewed: 11/17/2012 °[employer]® Patient Information ©2015 [employer]. This information is not intended to replace advice given to you by your health care provider. Make sure you discuss any questions you have with your health care provider. ° °Furosemide tablets °What is this medicine? °FUROSEMIDE (fyoor [place] se mide) is a diuretic. It helps you make more urine and to lose salt and excess water from your body. This medicine is used to treat   high blood pressure, and edema or swelling from heart, kidney, or liver disease. °This medicine may be used for other purposes; ask your health care provider or pharmacist if you have questions. °COMMON BRAND NAME(S): Delone, Lasix °What should I tell my health care provider before I take this medicine? °They need to know if you have any of these conditions: °-abnormal blood electrolytes °-diarrhea or vomiting °-gout °-heart disease °-kidney disease, small amounts of urine, or difficulty passing urine °-liver disease °-an unusual or allergic reaction to furosemide, sulfa drugs, other medicines, foods, dyes, or preservatives °-pregnant or  trying to get pregnant °-breast-feeding °How should I use this medicine? °Take this medicine by mouth with a glass of water. Follow the directions on the prescription label. You may take this medicine with or without food. If it upsets your stomach, take it with food or milk. Do not take your medicine more often than directed. Remember that you will need to pass more urine after taking this medicine. Do not take your medicine at a time of day that will cause you problems. Do not take at bedtime. °Talk to your pediatrician regarding the use of this medicine in children. While this drug may be prescribed for selected conditions, precautions do apply. °Overdosage: If you think you have taken too much of this medicine contact a poison control center or emergency room at once. °NOTE: This medicine is only for you. Do not share this medicine with others. °What if I miss a dose? °If you miss a dose, take it as soon as you can. If it is almost time for your next dose, take only that dose. Do not take double or extra doses. °What may interact with this medicine? °-aspirin and aspirin-like medicines °-certain antibiotics °-chloral hydrate °-cisplatin °-cyclosporine °-digoxin °-diuretics °-laxatives °-lithium °-medicines for blood pressure °-medicines that relax muscles for surgery °-methotrexate °-NSAIDs, medicines for pain and inflammation like ibuprofen, naproxen, or indomethacin °-phenytoin °-steroid medicines like prednisone or cortisone °-sucralfate °This list may not describe all possible interactions. Give your health care provider a list of all the medicines, herbs, non-prescription drugs, or dietary supplements you use. Also tell them if you smoke, drink alcohol, or use illegal drugs. Some items may interact with your medicine. °What should I watch for while using this medicine? °Visit your doctor or health care professional for regular checks on your progress. Check your blood pressure regularly. Ask your doctor or  health care professional what your blood pressure should be, and when you should contact him or her. If you are a diabetic, check your blood sugar as directed. °You may need to be on a special diet while taking this medicine. Check with your doctor. Also, ask how many glasses of fluid you need to drink a day. You must not get dehydrated. °You may get drowsy or dizzy. Do not drive, use machinery, or do anything that needs mental alertness until you know how this drug affects you. Do not stand or sit up quickly, especially if you are an older patient. This reduces the risk of dizzy or fainting spells. Alcohol can make you more drowsy and dizzy. Avoid alcoholic drinks. °This medicine can make you more sensitive to the sun. Keep out of the sun. If you cannot avoid being in the sun, wear protective clothing and use sunscreen. Do not use sun lamps or tanning beds/booths. °What side effects may I notice from receiving this medicine? °Side effects that you should report to your doctor or health care professional as   soon as possible: °-blood in urine or stools °-dry mouth °-fever or chills °-hearing loss or ringing in the ears °-irregular heartbeat °-muscle pain or weakness, cramps °-skin rash °-stomach upset, pain, or nausea °-tingling or numbness in the hands or feet °-unusually weak or tired °-vomiting or diarrhea °-yellowing of the eyes or skin °Side effects that usually do not require medical attention (report to your doctor or health care professional if they continue or are bothersome): °-headache °-loss of appetite °-unusual bleeding or bruising °This list may not describe all possible side effects. Call your doctor for medical advice about side effects. You may report side effects to FDA at 1-800-FDA-1088. °Where should I keep my medicine? °Keep out of the reach of children. °Store at room temperature between 15 and 30 degrees C (59 and 86 degrees F). Protect from light. Throw away any unused medicine after the  expiration date. °NOTE: This sheet is a summary. It may not cover all possible information. If you have questions about this medicine, talk to your doctor, pharmacist, or health care provider. °© 2015, [employer]. (2009-10-06 16:24:50) ° °

## 2014-07-06 NOTE — ED Provider Notes (Signed)
CSN: [id number]     Arrival date & time 07/06/14  0229 History   First MD Initiated Contact with Patient 07/06/14 0231     Chief Complaint  Patient presents with  . Shortness of Breath     (Consider location/radiation/quality/duration/timing/severity/associated sxs/prior Treatment) Patient is a 80 y.o. female presenting with shortness of breath. The history is provided by a caregiver and the EMS personnel. The history is limited by the condition of the patient (Dementia).  Shortness of Breath She was transported from home because of difficulty breathing. She has a history of dementia and is not a reliable history. EMS reports oxygen saturations varying between 90 and 95% on room. On arrival, she briefly had an oxygen saturation level of 74% but came up with applying nasal oxygen. Patient currently has no complaints.  Past Medical History  Diagnosis Date  . Alzheimer disease    Past Surgical History  Procedure Laterality Date  . Hip surgery Right    History reviewed. No pertinent family history. History  Substance Use Topics  . Smoking status: Never Smoker   . Smokeless tobacco: Not on file  . Alcohol Use: Yes   OB History   Grav Para Term Preterm Abortions TAB SAB Ect Mult Living                 Review of Systems  Unable to perform ROS: Dementia  Respiratory: Positive for shortness of breath.       Allergies  Review of patient's allergies indicates no known allergies.  Home Medications   Prior to Admission medications   Medication Sig Start Date End Date Taking? Authorizing Provider  calcium carbonate (OS-CAL) 600 MG TABS tablet Take 600 mg by mouth every morning.    Historical Provider, MD  donepezil (ARICEPT) 10 MG tablet Take 1 tablet by mouth at bedtime.  07/12/13   Historical Provider, MD  levothyroxine (SYNTHROID, LEVOTHROID) 50 MCG tablet Take 1 tablet by mouth every morning.  07/12/13   Historical Provider, MD  magnesium oxide (MAG-OX) 400 MG tablet Take 400 mg  by mouth every morning.    Historical Provider, MD  NAMENDA XR 14 MG CP24 Take 1 tablet by mouth every morning.  07/12/13   Historical Provider, MD  omeprazole (PRILOSEC) 20 MG capsule Take 1 capsule by mouth every morning.  07/12/13   Historical Provider, MD   BP 109/54  Pulse 90  Resp 20  SpO2 100% Physical Exam  Nursing note and vitals reviewed.  80 year old female, resting comfortably and in no acute distress. Vital signs are normal. Oxygen saturation is 100%, which is normal. Head is normocephalic and atraumatic. PERRLA, EOMI. Oropharynx is clear. Neck is nontender and supple without adenopathy or JVD. Back is nontender and there is no CVA tenderness. Lungs have bibasilar rales about halfway up, and scattered wheezes. Chest is nontender. Heart has regular rate and rhythm without murmur. Abdomen is soft, flat, nontender without masses or hepatosplenomegaly and peristalsis is normoactive. Extremities have no cyanosis or edema, full range of motion is present. Skin is warm and dry without rash. Neurologic: She is awake and alert, oriented to person but not place or time, cranial nerves are intact, there are no motor or sensory deficits.  ED Course  Procedures (including critical care time) Labs Review Results for orders placed during the hospital encounter of 07/06/14  CBC WITH DIFFERENTIAL      Result Value Ref Range   WBC 10.4  4.0 - 10.5 K/uL  RBC 4.47  3.87 - 5.11 MIL/uL   Hemoglobin 9.8 (*) 12.0 - 15.0 g/dL   HCT 16.1 (*) 09.6 - 04.5 %   MCV 69.1 (*) 78.0 - 100.0 fL   MCH 21.9 (*) 26.0 - 34.0 pg   MCHC 31.7  30.0 - 36.0 g/dL   RDW 40.9 (*) 81.1 - 91.4 %   Platelets 265  150 - 400 K/uL   Neutrophils Relative % 87 (*) 43 - 77 %   Neutro Abs 9.0 (*) 1.7 - 7.7 K/uL   Lymphocytes Relative 7 (*) 12 - 46 %   Lymphs Abs 0.7  0.7 - 4.0 K/uL   Monocytes Relative 5  3 - 12 %   Monocytes Absolute 0.6  0.1 - 1.0 K/uL   Eosinophils Relative 0  0 - 5 %   Eosinophils Absolute 0.0   0.0 - 0.7 K/uL   Basophils Relative 0  0 - 1 %   Basophils Absolute 0.0  0.0 - 0.1 K/uL  BASIC METABOLIC PANEL      Result Value Ref Range   Sodium 143  137 - 147 mEq/L   Potassium 4.2  3.7 - 5.3 mEq/L   Chloride 104  96 - 112 mEq/L   CO2 25  19 - 32 mEq/L   Glucose, Bld 144 (*) 70 - 99 mg/dL   BUN 28 (*) 6 - 23 mg/dL   Creatinine, Ser 7.82 (*) 0.50 - 1.10 mg/dL   Calcium 8.2 (*) 8.4 - 10.5 mg/dL   GFR calc non Af Amer 36 (*) >90 mL/min   GFR calc Af Amer 41 (*) >90 mL/min   Anion gap 14  5 - 15  PRO B NATRIURETIC PEPTIDE      Result Value Ref Range   Pro B Natriuretic peptide (BNP) 41304.0 (*) 0 - 450 pg/mL  TROPONIN I      Result Value Ref Range   Troponin I <0.30  <0.30 ng/mL   Imaging Review Dg Chest Port 1 View  07/06/2014   CLINICAL DATA:  Dyspnea  EXAM: PORTABLE CHEST - 1 VIEW  COMPARISON:  03/22/2011  FINDINGS: Cardiac enlargement with interval development of perihilar airspace disease likely representing edema. Bilateral pleural effusions greater on the right. No pneumothorax. Calcified aorta.  IMPRESSION: Cardiac enlargement with interval development of perihilar airspace disease, likely edema, and bilateral pleural effusions.   Electronically Signed   By: Burman Nieves M.D.   On: 07/06/2014 02:54   Images viewed by me.    EKG Interpretation   Date/Time:  Saturday July 06 2014 02:35:56 EDT Ventricular Rate:  89 PR Interval:  152 QRS Duration: 128 QT Interval:  417 QTC Calculation: 507 R Axis:   176 Text Interpretation:  Sinus rhythm Probable left atrial enlargement  Nonspecific intraventricular conduction delay Anterolateral infarct, old  When compared with ECG of 08/05/2013, Non-specific intra-ventricular  conduction delay has replaced Left bundle branch block Confirmed by [facility]   MD, Tedd Cottrill ([id number]) on 07/06/2014 2:40:40 AM      MDM   Final diagnoses:  Congestive heart failure, unspecified congestive heart failure chronicity, unspecified congestive  heart failure type  Microcytic anemia  Renal insufficiency    Acute dyspnea. Chest x-ray will be obtained to evaluate for possible pneumonia and she also been screened for congestive heart failure. Old records are reviewed and she has no relevant past visits.  Workup is significant for a chest x-ray consistent with congestive heart care, and markedly elevated BNP. Anemia is slightly worse  than baseline and mild renal insufficiency is also slightly worse than baseline. She is given a dose of furosemide. Dr. Conley Rolls of [facility] has agreed to see the patient in consult.  Dr. Irwin Brakeman consult appreciated. In light of her age, and normal vital signs and adequate oxygenation on room air, he feels that she can be treated adequately at home. He has contacted family member who is power of attorney to discuss this who has agreed. She had been given a dose of furosemide in the ED and has had adequate urine output. She is discharged with prescription for furosemide and instructed to follow up with her PCP in the next 3-4 days. Return to the ED if having any difficulty breathing at home.  Dione Booze, MD 07/06/14 [phone]

## 2014-07-09 ENCOUNTER — Emergency Department (HOSPITAL_COMMUNITY): Payer: Medicare Other

## 2014-07-09 ENCOUNTER — Inpatient Hospital Stay (HOSPITAL_COMMUNITY)
Admission: EM | Admit: 2014-07-09 | Discharge: 2014-07-12 | DRG: 292 | Disposition: A | Discharge status: Hospice | Payer: Medicare Other | Attending: Internal Medicine | Admitting: Internal Medicine

## 2014-07-09 ENCOUNTER — Encounter (HOSPITAL_COMMUNITY): Payer: Self-pay | Admitting: Emergency Medicine

## 2014-07-09 DIAGNOSIS — F068 Other specified mental disorders due to known physiological condition: Secondary | ICD-10-CM

## 2014-07-09 DIAGNOSIS — I5043 Acute on chronic combined systolic (congestive) and diastolic (congestive) heart failure: Principal | ICD-10-CM | POA: Diagnosis present

## 2014-07-09 DIAGNOSIS — T502X5A Adverse effect of carbonic-anhydrase inhibitors, benzothiadiazides and other diuretics, initial encounter: Secondary | ICD-10-CM | POA: Diagnosis present

## 2014-07-09 DIAGNOSIS — F0280 Dementia in other diseases classified elsewhere without behavioral disturbance: Secondary | ICD-10-CM | POA: Diagnosis present

## 2014-07-09 DIAGNOSIS — E440 Moderate protein-calorie malnutrition: Secondary | ICD-10-CM | POA: Insufficient documentation

## 2014-07-09 DIAGNOSIS — L8991 Pressure ulcer of unspecified site, stage 1: Secondary | ICD-10-CM | POA: Diagnosis present

## 2014-07-09 DIAGNOSIS — Z2911 Encounter for prophylactic immunotherapy for respiratory syncytial virus (RSV): Secondary | ICD-10-CM

## 2014-07-09 DIAGNOSIS — D72829 Elevated white blood cell count, unspecified: Secondary | ICD-10-CM

## 2014-07-09 DIAGNOSIS — I509 Heart failure, unspecified: Secondary | ICD-10-CM | POA: Diagnosis present

## 2014-07-09 DIAGNOSIS — J40 Bronchitis, not specified as acute or chronic: Secondary | ICD-10-CM | POA: Diagnosis present

## 2014-07-09 DIAGNOSIS — G309 Alzheimer's disease, unspecified: Secondary | ICD-10-CM | POA: Diagnosis present

## 2014-07-09 DIAGNOSIS — F0390 Unspecified dementia without behavioral disturbance: Secondary | ICD-10-CM | POA: Diagnosis present

## 2014-07-09 DIAGNOSIS — E876 Hypokalemia: Secondary | ICD-10-CM | POA: Diagnosis present

## 2014-07-09 DIAGNOSIS — I4891 Unspecified atrial fibrillation: Secondary | ICD-10-CM | POA: Diagnosis present

## 2014-07-09 DIAGNOSIS — R0989 Other specified symptoms and signs involving the circulatory and respiratory systems: Secondary | ICD-10-CM | POA: Diagnosis not present

## 2014-07-09 DIAGNOSIS — N390 Urinary tract infection, site not specified: Secondary | ICD-10-CM | POA: Diagnosis present

## 2014-07-09 DIAGNOSIS — N179 Acute kidney failure, unspecified: Secondary | ICD-10-CM | POA: Diagnosis present

## 2014-07-09 DIAGNOSIS — R0609 Other forms of dyspnea: Secondary | ICD-10-CM | POA: Diagnosis not present

## 2014-07-09 DIAGNOSIS — E870 Hyperosmolality and hypernatremia: Secondary | ICD-10-CM | POA: Diagnosis present

## 2014-07-09 DIAGNOSIS — Z515 Encounter for palliative care: Secondary | ICD-10-CM

## 2014-07-09 DIAGNOSIS — Z66 Do not resuscitate: Secondary | ICD-10-CM | POA: Diagnosis present

## 2014-07-09 DIAGNOSIS — L89609 Pressure ulcer of unspecified heel, unspecified stage: Secondary | ICD-10-CM | POA: Diagnosis present

## 2014-07-09 DIAGNOSIS — Z681 Body mass index (BMI) 19 or less, adult: Secondary | ICD-10-CM

## 2014-07-09 DIAGNOSIS — L89601 Pressure ulcer of unspecified heel, stage 1: Secondary | ICD-10-CM

## 2014-07-09 DIAGNOSIS — R0600 Dyspnea, unspecified: Secondary | ICD-10-CM

## 2014-07-09 LAB — URINE MICROSCOPIC-ADD ON

## 2014-07-09 LAB — URINALYSIS, ROUTINE W REFLEX MICROSCOPIC
Bilirubin Urine: NEGATIVE
Glucose, UA: NEGATIVE mg/dL
KETONES UR: NEGATIVE mg/dL
LEUKOCYTES UA: NEGATIVE
NITRITE: POSITIVE — AB
PH: 6 (ref 5.0–8.0)
PROTEIN: NEGATIVE mg/dL
Specific Gravity, Urine: 1.01 (ref 1.005–1.030)
Urobilinogen, UA: 0.2 mg/dL (ref 0.0–1.0)

## 2014-07-09 LAB — COMPREHENSIVE METABOLIC PANEL
ALT: 45 U/L — ABNORMAL HIGH (ref 0–35)
AST: 31 U/L (ref 0–37)
Albumin: 2.6 g/dL — ABNORMAL LOW (ref 3.5–5.2)
Alkaline Phosphatase: 109 U/L (ref 39–117)
Anion gap: 18 — ABNORMAL HIGH (ref 5–15)
BUN: 42 mg/dL — ABNORMAL HIGH (ref 6–23)
CALCIUM: 8.7 mg/dL (ref 8.4–10.5)
CO2: 23 mEq/L (ref 19–32)
Chloride: 107 mEq/L (ref 96–112)
Creatinine, Ser: 1.41 mg/dL — ABNORMAL HIGH (ref 0.50–1.10)
GFR calc non Af Amer: 31 mL/min — ABNORMAL LOW (ref >90)
GFR, EST AFRICAN AMERICAN: 35 mL/min — AB (ref >90)
Glucose, Bld: 158 mg/dL — ABNORMAL HIGH (ref 70–99)
Potassium: 4 mEq/L (ref 3.7–5.3)
Sodium: 148 mEq/L — ABNORMAL HIGH (ref 137–147)
TOTAL PROTEIN: 7.6 g/dL (ref 6.0–8.3)
Total Bilirubin: 0.4 mg/dL (ref 0.3–1.2)

## 2014-07-09 LAB — CBC WITH DIFFERENTIAL/PLATELET
Basophils Absolute: 0 10*3/uL (ref 0.0–0.1)
Basophils Relative: 0 % (ref 0–1)
EOS ABS: 0 10*3/uL (ref 0.0–0.7)
Eosinophils Relative: 0 % (ref 0–5)
HCT: 35.5 % — ABNORMAL LOW (ref 36.0–46.0)
Hemoglobin: 11.4 g/dL — ABNORMAL LOW (ref 12.0–15.0)
Lymphocytes Relative: 4 % — ABNORMAL LOW (ref 12–46)
Lymphs Abs: 0.5 10*3/uL — ABNORMAL LOW (ref 0.7–4.0)
MCH: 22.2 pg — AB (ref 26.0–34.0)
MCHC: 32.1 g/dL (ref 30.0–36.0)
MCV: 69.2 fL — AB (ref 78.0–100.0)
MONO ABS: 0.8 10*3/uL (ref 0.1–1.0)
Monocytes Relative: 6 % (ref 3–12)
Neutro Abs: 12.2 10*3/uL — ABNORMAL HIGH (ref 1.7–7.7)
Neutrophils Relative %: 90 % — ABNORMAL HIGH (ref 43–77)
PLATELETS: ADEQUATE 10*3/uL (ref 150–400)
RBC: 5.13 MIL/uL — ABNORMAL HIGH (ref 3.87–5.11)
RDW: 16.4 % — ABNORMAL HIGH (ref 11.5–15.5)
SMEAR REVIEW: ADEQUATE
WBC: 13.5 10*3/uL — ABNORMAL HIGH (ref 4.0–10.5)

## 2014-07-09 LAB — PRO B NATRIURETIC PEPTIDE: Pro B Natriuretic peptide (BNP): >70000 pg/mL — ABNORMAL HIGH (ref 0–450)

## 2014-07-09 LAB — TROPONIN I

## 2014-07-09 MED ORDER — LORAZEPAM 2 MG/ML IJ SOLN
INTRAMUSCULAR | Status: AC
  Administered 2014-07-09: 1 mg via INTRAMUSCULAR
  Filled 2014-07-09: qty 1

## 2014-07-09 MED ORDER — FUROSEMIDE 10 MG/ML IJ SOLN
40.0000 mg | Freq: Once | INTRAMUSCULAR | Status: AC
  Administered 2014-07-09: 40 mg via INTRAVENOUS
  Filled 2014-07-09: qty 4

## 2014-07-09 MED ORDER — LORAZEPAM 2 MG/ML IJ SOLN
1.0000 mg | Freq: Once | INTRAMUSCULAR | Status: AC
Start: 2014-07-09 — End: 2014-07-09
  Administered 2014-07-09: 1 mg via INTRAMUSCULAR

## 2014-07-09 MED ORDER — SODIUM CHLORIDE 0.9 % IJ SOLN
3.0000 mL | Freq: Two times a day (BID) | INTRAMUSCULAR | Status: DC
  Administered 2014-07-10 – 2014-07-11 (×3): 3 mL via INTRAVENOUS

## 2014-07-09 MED ORDER — FUROSEMIDE 10 MG/ML IJ SOLN
40.0000 mg | Freq: Two times a day (BID) | INTRAMUSCULAR | Status: DC
  Administered 2014-07-10 – 2014-07-12 (×6): 40 mg via INTRAVENOUS
  Filled 2014-07-09 (×6): qty 4

## 2014-07-09 MED ORDER — SODIUM CHLORIDE 0.9 % IJ SOLN
3.0000 mL | Freq: Two times a day (BID) | INTRAMUSCULAR | Status: DC
  Administered 2014-07-10: 3 mL via INTRAVENOUS

## 2014-07-09 MED ORDER — SODIUM CHLORIDE 0.9 % IJ SOLN
3.0000 mL | INTRAMUSCULAR | Status: DC | PRN
  Administered 2014-07-10: 3 mL via INTRAVENOUS

## 2014-07-09 MED ORDER — HEPARIN SODIUM (PORCINE) 5000 UNIT/ML IJ SOLN
5000.0000 [IU] | Freq: Three times a day (TID) | INTRAMUSCULAR | Status: DC
  Administered 2014-07-10 – 2014-07-12 (×8): 5000 [IU] via SUBCUTANEOUS
  Filled 2014-07-09 (×8): qty 1

## 2014-07-09 MED ORDER — SODIUM CHLORIDE 0.9 % IV SOLN: 250.0000 mL | INTRAVENOUS | Status: DC | PRN

## 2014-07-09 NOTE — ED Notes (Signed)
Son is going back to mother house. Dannielle Huh Hosea [phone]

## 2014-07-09 NOTE — H&P (Addendum)
Hospitalist Admission History and Physical  Patient name: [name] Medical record number: [id number] Date of birth: [birth year] Age: 80 y.o. Gender: female  Primary Care Provider: Cassell Smiles., MD  Chief Complaint: dyspnea, CHF exacerbation  History of Present Illness:This is a 80 y.o. year old female with significant past medical history of dementia, anemia presenting with dyspnea, CHF exacerbation, AKI, leukocytosis. Pt's son at the bedisde. Primary historian. Reports pt with increased WOB over past 2 days as well as increased orthopnea and PND. States that pt was seen in ER for similar sxs 4-5 days ago. Was d//c'd on diuretic regimen. Has caregivers at home. Reports compliance with medication regimen. Also with midly worsening confusion. Son denies any reports of CP, nausea, vomiting, diarrhea. Has poor appetite at baseline.  Afebrile on presentation. Hemodynanically stable. HR variable from 20s-100s. Mainly in upper 90s. BP 110s-140s. Satting >93 % on RA. Trop neg x 1. Pro BNP > 70K. hgb 11.4, Cr 1.4. EKG w/ sinus rhythm, aberrant ST and T wave changes.  CXR w/ cardiac enlargement and pulm vascular congestion mildly worsened since last study.   Assessment and Plan: [name] is a 80 y.o. year old female presenting with dyspnea, CHF exacerbation, AKI, leukocytosis, AKI, leukocytosis .   Active Problems:   Dyspnea   CHF exacerbation   AKI (acute kidney injury)   Leukocytosis   1-Dyspnea -Likely secondary to CHF exacerbation -diurese pt -strict is and Os  -Daily weight -2D ECHO  -tele bed -Cycle CEs.  - follow fluid status closely  2-AKI  -baseline Cr 1-1.2 -1.4 today  -gently diurese pt  -follow Cr  -HOLD offending agents apart from diuretic   3-Leukocytosis -somewhat unclear etiology -noted ? pulm infiltrates on imaging in setting of CHF exacerbation.  -UA indicative of infection -pancx -Start on rocephin and azithro for CAP and UTI cx-may broaden if t spikes fever.    4-Dementia -Cont  namenda once pt more arousable   FEN/GI: NP  Prophylaxis: sub q heparin  Disposition: pending further evaluation  Code Status:Full Code    Patient Active Problem List   Diagnosis Date Noted  . Dyspnea 07/09/2014  . CHF exacerbation 07/09/2014  . ANEMIA, CHRONIC 01/27/2009  . DEMENTIA 01/27/2009  . CONSTIPATION 01/27/2009  . SYNCOPE 01/27/2009  . NAUSEA AND VOMITING 01/27/2009  . ANOREXIA, HX OF 01/27/2009   Past Medical History: Past Medical History  Diagnosis Date  . Alzheimer disease     Past Surgical History: Past Surgical History  Procedure Laterality Date  . Hip surgery Right     Social History: History   Social History  . Marital Status: Married    Spouse Name: N/A    Number of Children: N/A  . Years of Education: N/A   Social History Main Topics  . Smoking status: Never Smoker   . Smokeless tobacco: None  . Alcohol Use: Yes  . Drug Use: No  . Sexual Activity: None   Other Topics Concern  . None   Social History Narrative  . None    Family History: History reviewed. No pertinent family history.  Allergies: No Known Allergies  Current Facility-Administered Medications  Medication Dose Route Frequency Provider Last Rate Last Dose  . 0.9 %  sodium chloride infusion  250 mL Intravenous PRN Doree Albee, MD      . Melene Muller ON 07/10/2014] furosemide (LASIX) injection 40 mg  40 mg Intravenous Q12H Doree Albee, MD      . Melene Muller ON 07/10/2014] heparin injection 5,000 Units  5,000 Units Subcutaneous 3 times per day Doree Albee, MD      . Melene Muller ON 07/10/2014] sodium chloride 0.9 % injection 3 mL  3 mL Intravenous Q12H Doree Albee, MD      . Melene Muller ON 07/10/2014] sodium chloride 0.9 % injection 3 mL  3 mL Intravenous Q12H Doree Albee, MD      . sodium chloride 0.9 % injection 3 mL  3 mL Intravenous PRN Doree Albee, MD       Current Outpatient Prescriptions  Medication Sig Dispense Refill  . calcium carbonate (ANTACID) 500 MG chewable tablet Chew 1  tablet by mouth every morning.      . donepezil (ARICEPT) 10 MG tablet Take 1 tablet by mouth at bedtime.       . furosemide (LASIX) 40 MG tablet Take 1 tablet (40 mg total) by mouth daily.  15 tablet  0  . levothyroxine (SYNTHROID, LEVOTHROID) 50 MCG tablet Take 1 tablet by mouth every morning.       . magnesium oxide (MAG-OX) 400 MG tablet Take 400 mg by mouth every morning.      Marland Kitchen NAMENDA XR 14 MG CP24 Take 1 tablet by mouth every morning.        Review Of Systems: 12 point ROS negative except as noted above in HPI.  Physical Exam: Filed Vitals:   07/09/14 2330  BP: 131/59  Pulse: 102  Temp:   Resp: 24    General: minimally arousable on exam, intermittently snoring HEENT: PERRLA and extra ocular movement intact Heart: S1, S2 normal, no murmur, rub or gallop, regular rate and rhythm Lungs: clear to auscultation, no wheezes or rales and unlabored breathing Abdomen: abdomen is soft without significant tenderness, masses, organomegaly or guarding Extremities: extremities normal, atraumatic, no cyanosis or edema Skin:no rashes Neurology: minimally cooperative to exam, no focal deficits notes  Labs and Imaging: Lab Results  Component Value Date/Time   NA 148* 07/09/2014 10:00 PM   K 4.0 07/09/2014 10:00 PM   CL 107 07/09/2014 10:00 PM   CO2 23 07/09/2014 10:00 PM   BUN 42* 07/09/2014 10:00 PM   CREATININE 1.41* 07/09/2014 10:00 PM   GLUCOSE 158* 07/09/2014 10:00 PM   Lab Results  Component Value Date   WBC 13.5* 07/09/2014   HGB 11.4* 07/09/2014   HCT 35.5* 07/09/2014   MCV 69.2* 07/09/2014   PLT PLATELET CLUMPS NOTED ON SMEAR, COUNT APPEARS ADEQUATE 07/09/2014    Dg Chest Portable 1 View  07/09/2014   CLINICAL DATA:  Shortness of breath for several days.  EXAM: PORTABLE CHEST - 1 VIEW  COMPARISON:  07/06/2014  FINDINGS: Cardiac enlargement. Pulmonary vascular congestion. Increasing diffuse bilateral airspace and interstitial infiltration likely representing edema. Small bilateral pleural  effusions.  IMPRESSION: Cardiac enlargement, pulmonary vascular congestion, bilateral pulmonary infiltrates/edema, and bilateral pleural effusions. Mild progression since previous study.   Electronically Signed   By: Burman Nieves M.D.   On: 07/09/2014 21:11           Doree Albee MD  Pager: [phone]

## 2014-07-09 NOTE — ED Notes (Signed)
Family at the bedside, came in from out of town, states last time pt could not be admitted because someone was not here to sign. They stated she has 24 hr caregiver, they called them to say pt yells out at night, seems to be more sob, they are concerned with fluid build up.

## 2014-07-09 NOTE — ED Notes (Signed)
PT arrives EMS  BP 139/85 HR 89, 91 % on room air, pt has dementia, will not where [place].  Pt has crackers in in bases. Called out for difficulty breathing

## 2014-07-09 NOTE — ED Notes (Signed)
Pt is combative with any movement, family asking about giving med to calm down before doing IV and Foley, MD aware, orders given.

## 2014-07-09 NOTE — ED Provider Notes (Signed)
Pt lives at home.  Sent to the ED for worsening symptoms, shortness of breath.  Seen in the ED previously.  Likely will require admission.  Pulse rate documented at 23, Dr Estell Harpin evaluated patient at the bedside, heart rate is in the 90s.  Labs pending.  Linwood Dibbles, MD 07/09/14 2228

## 2014-07-09 NOTE — ED Provider Notes (Signed)
CSN: [id number]     Arrival date & time 07/09/14  1957 History   First MD Initiated Contact with Patient 07/09/14 2020     Chief Complaint  Patient presents with  . Shortness of Breath     (Consider location/radiation/quality/duration/timing/severity/associated sxs/prior Treatment) Patient is a 80 y.o. female presenting with shortness of breath. The history is provided by a caregiver (the family states she is getting more sob.  she was seen here 3 days ago with chf and lasix increased.  pt not improving.).  Shortness of Breath Severity:  Moderate Onset quality:  Gradual Timing:  Constant Progression:  Worsening Chronicity:  New Context: not animal exposure     Past Medical History  Diagnosis Date  . Alzheimer disease    Past Surgical History  Procedure Laterality Date  . Hip surgery Right    History reviewed. No pertinent family history. History  Substance Use Topics  . Smoking status: Never Smoker   . Smokeless tobacco: Not on file  . Alcohol Use: Yes   OB History   Grav Para Term Preterm Abortions TAB SAB Ect Mult Living                 Review of Systems  Unable to perform ROS: Dementia      Allergies  Review of patient's allergies indicates no known allergies.  Home Medications   Prior to Admission medications   Medication Sig Start Date End Date Taking? Authorizing Provider  calcium carbonate (ANTACID) 500 MG chewable tablet Chew 1 tablet by mouth every morning.   Yes Historical Provider, MD  donepezil (ARICEPT) 10 MG tablet Take 1 tablet by mouth at bedtime.  07/12/13  Yes Historical Provider, MD  furosemide (LASIX) 40 MG tablet Take 1 tablet (40 mg total) by mouth daily. 07/06/14  Yes Dione Booze, MD  levothyroxine (SYNTHROID, LEVOTHROID) 50 MCG tablet Take 1 tablet by mouth every morning.  07/12/13  Yes Historical Provider, MD  magnesium oxide (MAG-OX) 400 MG tablet Take 400 mg by mouth every morning.   Yes Historical Provider, MD  NAMENDA XR 14 MG CP24  Take 1 tablet by mouth every morning.  07/12/13  Yes Historical Provider, MD   BP 140/56  Pulse 29  Temp(Src) 96.7 F (35.9 C) (Axillary)  Resp 24  Ht  (1.626 m)  Wt 105 lb (47.628 kg)  BMI 18.01 kg/m2  SpO2 100% Physical Exam  Constitutional:  cachetic  HENT:  Head: Normocephalic.  Eyes: Conjunctivae and EOM are normal. No scleral icterus.  Neck: Neck supple. No thyromegaly present.  Cardiovascular: Normal rate.  Exam reveals no gallop.   No murmur heard. Pulmonary/Chest: No stridor. She has no wheezes. She has rales. She exhibits no tenderness.  Abdominal: She exhibits no distension. There is no tenderness. There is no rebound.  Musculoskeletal: She exhibits no edema.  Lymphadenopathy:    She has no cervical adenopathy.  Neurological: She is alert. She exhibits normal muscle tone. Coordination normal.  Pt awake and combative.  She is not oriented to anything  Skin: No rash noted. No erythema.    ED Course  Procedures (including critical care time) Labs Review Labs Reviewed  CBC WITH DIFFERENTIAL  COMPREHENSIVE METABOLIC PANEL  TROPONIN I  URINALYSIS, ROUTINE W REFLEX MICROSCOPIC  PRO B NATRIURETIC PEPTIDE    Imaging Review Dg Chest Portable 1 View  07/09/2014   CLINICAL DATA:  Shortness of breath for several days.  EXAM: PORTABLE CHEST - 1 VIEW  COMPARISON:  07/06/2014  FINDINGS: Cardiac enlargement. Pulmonary vascular congestion. Increasing diffuse bilateral airspace and interstitial infiltration likely representing edema. Small bilateral pleural effusions.  IMPRESSION: Cardiac enlargement, pulmonary vascular congestion, bilateral pulmonary infiltrates/edema, and bilateral pleural effusions. Mild progression since previous study.   Electronically Signed   By: Burman Nieves M.D.   On: 07/09/2014 21:11     EKG Interpretation None      MDM   Final diagnoses:  None        Benny Lennert, MD 07/09/14 2154

## 2014-07-09 NOTE — ED Notes (Signed)
Pt yells out with any type care, in gown and pulled up in bed. When not touching pt , appears in no distress

## 2014-07-10 ENCOUNTER — Encounter (HOSPITAL_COMMUNITY): Payer: Self-pay | Admitting: *Deleted

## 2014-07-10 DIAGNOSIS — N179 Acute kidney failure, unspecified: Secondary | ICD-10-CM | POA: Diagnosis present

## 2014-07-10 DIAGNOSIS — E876 Hypokalemia: Secondary | ICD-10-CM | POA: Diagnosis present

## 2014-07-10 DIAGNOSIS — I509 Heart failure, unspecified: Secondary | ICD-10-CM | POA: Diagnosis present

## 2014-07-10 DIAGNOSIS — Z681 Body mass index (BMI) 19 or less, adult: Secondary | ICD-10-CM | POA: Diagnosis not present

## 2014-07-10 DIAGNOSIS — Z2911 Encounter for prophylactic immunotherapy for respiratory syncytial virus (RSV): Secondary | ICD-10-CM | POA: Diagnosis not present

## 2014-07-10 DIAGNOSIS — N390 Urinary tract infection, site not specified: Secondary | ICD-10-CM | POA: Diagnosis present

## 2014-07-10 DIAGNOSIS — R0989 Other specified symptoms and signs involving the circulatory and respiratory systems: Secondary | ICD-10-CM | POA: Diagnosis present

## 2014-07-10 DIAGNOSIS — F0390 Unspecified dementia without behavioral disturbance: Secondary | ICD-10-CM | POA: Diagnosis present

## 2014-07-10 DIAGNOSIS — R0609 Other forms of dyspnea: Secondary | ICD-10-CM | POA: Diagnosis present

## 2014-07-10 DIAGNOSIS — T502X5A Adverse effect of carbonic-anhydrase inhibitors, benzothiadiazides and other diuretics, initial encounter: Secondary | ICD-10-CM | POA: Diagnosis present

## 2014-07-10 DIAGNOSIS — F0280 Dementia in other diseases classified elsewhere without behavioral disturbance: Secondary | ICD-10-CM | POA: Diagnosis present

## 2014-07-10 DIAGNOSIS — D72829 Elevated white blood cell count, unspecified: Secondary | ICD-10-CM

## 2014-07-10 DIAGNOSIS — I4891 Unspecified atrial fibrillation: Secondary | ICD-10-CM | POA: Diagnosis present

## 2014-07-10 DIAGNOSIS — J40 Bronchitis, not specified as acute or chronic: Secondary | ICD-10-CM | POA: Diagnosis present

## 2014-07-10 DIAGNOSIS — L8991 Pressure ulcer of unspecified site, stage 1: Secondary | ICD-10-CM | POA: Diagnosis present

## 2014-07-10 DIAGNOSIS — I5043 Acute on chronic combined systolic (congestive) and diastolic (congestive) heart failure: Secondary | ICD-10-CM | POA: Diagnosis present

## 2014-07-10 DIAGNOSIS — E440 Moderate protein-calorie malnutrition: Secondary | ICD-10-CM | POA: Diagnosis present

## 2014-07-10 DIAGNOSIS — Z515 Encounter for palliative care: Secondary | ICD-10-CM | POA: Diagnosis not present

## 2014-07-10 DIAGNOSIS — L89609 Pressure ulcer of unspecified heel, unspecified stage: Secondary | ICD-10-CM | POA: Diagnosis present

## 2014-07-10 DIAGNOSIS — Z66 Do not resuscitate: Secondary | ICD-10-CM | POA: Diagnosis present

## 2014-07-10 DIAGNOSIS — E870 Hyperosmolality and hypernatremia: Secondary | ICD-10-CM | POA: Diagnosis present

## 2014-07-10 LAB — COMPREHENSIVE METABOLIC PANEL
ALBUMIN: 2.5 g/dL — AB (ref 3.5–5.2)
ALK PHOS: 107 U/L (ref 39–117)
ALT: 41 U/L — ABNORMAL HIGH (ref 0–35)
AST: 30 U/L (ref 0–37)
Anion gap: 17 — ABNORMAL HIGH (ref 5–15)
BUN: 41 mg/dL — AB (ref 6–23)
CALCIUM: 8.5 mg/dL (ref 8.4–10.5)
CO2: 28 mEq/L (ref 19–32)
Chloride: 107 mEq/L (ref 96–112)
Creatinine, Ser: 1.38 mg/dL — ABNORMAL HIGH (ref 0.50–1.10)
GFR calc Af Amer: 36 mL/min — ABNORMAL LOW (ref >90)
GFR calc non Af Amer: 31 mL/min — ABNORMAL LOW (ref >90)
GLUCOSE: 115 mg/dL — AB (ref 70–99)
POTASSIUM: 4 meq/L (ref 3.7–5.3)
Sodium: 152 mEq/L — ABNORMAL HIGH (ref 137–147)
TOTAL PROTEIN: 7 g/dL (ref 6.0–8.3)
Total Bilirubin: 0.6 mg/dL (ref 0.3–1.2)

## 2014-07-10 LAB — CBC WITH DIFFERENTIAL/PLATELET
BASOS PCT: 0 % (ref 0–1)
Basophils Absolute: 0 10*3/uL (ref 0.0–0.1)
Eosinophils Absolute: 0.1 10*3/uL (ref 0.0–0.7)
Eosinophils Relative: 1 % (ref 0–5)
HEMATOCRIT: 36.1 % (ref 36.0–46.0)
HEMOGLOBIN: 11.4 g/dL — AB (ref 12.0–15.0)
LYMPHS ABS: 1.4 10*3/uL (ref 0.7–4.0)
LYMPHS PCT: 9 % — AB (ref 12–46)
MCH: 21.9 pg — ABNORMAL LOW (ref 26.0–34.0)
MCHC: 31.6 g/dL (ref 30.0–36.0)
MCV: 69.4 fL — ABNORMAL LOW (ref 78.0–100.0)
MONO ABS: 1.3 10*3/uL — AB (ref 0.1–1.0)
Monocytes Relative: 8 % (ref 3–12)
NEUTROS ABS: 13.1 10*3/uL — AB (ref 1.7–7.7)
Neutrophils Relative %: 82 % — ABNORMAL HIGH (ref 43–77)
Platelets: 247 10*3/uL (ref 150–400)
RBC: 5.2 MIL/uL — ABNORMAL HIGH (ref 3.87–5.11)
RDW: 16.5 % — ABNORMAL HIGH (ref 11.5–15.5)
WBC: 15.9 10*3/uL — AB (ref 4.0–10.5)

## 2014-07-10 LAB — TROPONIN I

## 2014-07-10 LAB — TSH: TSH: 1.53 u[IU]/mL (ref 0.350–4.500)

## 2014-07-10 MED ORDER — DEXTROSE 5 % IV SOLN
500.0000 mg | INTRAVENOUS | Status: DC
  Administered 2014-07-10 – 2014-07-12 (×3): 500 mg via INTRAVENOUS
  Filled 2014-07-10 (×6): qty 500

## 2014-07-10 MED ORDER — LEVALBUTEROL HCL 1.25 MG/0.5ML IN NEBU: 1.2500 mg | INHALATION_SOLUTION | Freq: Three times a day (TID) | RESPIRATORY_TRACT | Status: DC | PRN

## 2014-07-10 MED ORDER — MORPHINE SULFATE 2 MG/ML IJ SOLN
0.5000 mg | Freq: Once | INTRAMUSCULAR | Status: AC
  Administered 2014-07-10: 0.5 mg via INTRAVENOUS
  Filled 2014-07-10: qty 1

## 2014-07-10 MED ORDER — DEXTROSE 5 % IV SOLN
INTRAVENOUS | Status: AC
  Filled 2014-07-10: qty 500

## 2014-07-10 MED ORDER — BIOTENE DRY MOUTH MT LIQD
15.0000 mL | Freq: Two times a day (BID) | OROMUCOSAL | Status: DC
  Administered 2014-07-10 – 2014-07-12 (×4): 15 mL via OROMUCOSAL

## 2014-07-10 MED ORDER — CEFTRIAXONE SODIUM 1 G IJ SOLR
INTRAMUSCULAR | Status: AC
  Filled 2014-07-10: qty 10

## 2014-07-10 MED ORDER — INFLUENZA VAC SPLIT QUAD 0.5 ML IM SUSY
0.5000 mL | PREFILLED_SYRINGE | INTRAMUSCULAR | Status: AC
  Administered 2014-07-11: 0.5 mL via INTRAMUSCULAR
  Filled 2014-07-10: qty 0.5

## 2014-07-10 MED ORDER — BIOTENE DRY MOUTH MT LIQD: 15.0000 mL | OROMUCOSAL | Status: DC | PRN

## 2014-07-10 MED ORDER — BUDESONIDE 0.25 MG/2ML IN SUSP
0.2500 mg | Freq: Two times a day (BID) | RESPIRATORY_TRACT | Status: DC
  Administered 2014-07-10 – 2014-07-12 (×5): 0.25 mg via RESPIRATORY_TRACT
  Filled 2014-07-10 (×10): qty 2

## 2014-07-10 MED ORDER — DEXTROSE 5 % IV SOLN
1.0000 g | INTRAVENOUS | Status: DC
  Administered 2014-07-10 – 2014-07-12 (×3): 1 g via INTRAVENOUS
  Filled 2014-07-10 (×6): qty 10

## 2014-07-10 MED ORDER — MORPHINE SULFATE 2 MG/ML IJ SOLN
1.0000 mg | INTRAMUSCULAR | Status: DC | PRN
  Administered 2014-07-10: 1 mg via INTRAVENOUS
  Filled 2014-07-10: qty 1

## 2014-07-10 MED ORDER — MAGNESIUM SULFATE 40 MG/ML IJ SOLN
2.0000 g | Freq: Once | INTRAMUSCULAR | Status: AC
  Administered 2014-07-10: 2 g via INTRAVENOUS
  Filled 2014-07-10: qty 50

## 2014-07-10 NOTE — Progress Notes (Signed)
Patient is Afib on the monitor. Dr. Gwenlyn Perking notified.

## 2014-07-10 NOTE — Progress Notes (Signed)
Unable to obtain 02 sat on patient, spoke with nurse and she was worried about her being a mouth breather, so i placed a 45% venturi mask on patient for the night.

## 2014-07-10 NOTE — Progress Notes (Signed)
INITIAL NUTRITION ASSESSMENT  DOCUMENTATION CODES Per approved criteria  -Non-severe (moderate) malnutrition in the context of chronic illness   Pt meets criteria for moderate MALNUTRITION in the context of chronic illness as evidenced by <75% of estimated energy intake x 1 month, mild to moderate fat and muscle depletion.  INTERVENTION: RD will follow for diet advancement  NUTRITION DIAGNOSIS: Inadequate oral intake related to inability to eat as evidenced by NPO.   Goal: Pt will meet >90% of estimated nutrition needs  Monitor:  Diet advancement, PO intake, labs, weight changes, I/O's  Reason for Assessment: Low braden  80 y.o. female  Admitting Dx: <principal problem not specified>  This is a 80 y.o. year old female with significant past medical history of dementia, anemia presenting with dyspnea, CHF exacerbation. Pt's son at the bedisde. Primary historian. Reports pt with increased WOB over past 2 days as well as increased orthopnea and PND. States that pt was seen in ER for similar sxs 4-5 days ago. Was d//c'd on diuretic regimen. Has caregivers at home. Reports compliance with medication regimen. Also with midly worsening confusion. Son denies any reports of CP, nausea, vomiting, diarrhea. Has poor appetite at baseline.  Afebrile on presentation. Hemodynanically stable. HR variable from 20s-100s. Mainly in upper 90s. BP 110s-140s. Satting >93 % on RA. Trop neg x 1. Pro BNP > 70K. hgb 11.4, Cr 1.4. EKG w/ sinus rhythm, aberrant ST and T wave changes. CXR w/ cardiac enlargement and pulm vascular congestion mildly worsened since last study.   ASSESSMENT: Pt admitted for CHF exacerbation. Pt was drowsy and non conversant at time of visit. No family present. She is on a non-rebreather mask at this time.  Chart review indicates appetite is poor at baseline. Wt hx is limited, but indicates wt stability.  She is NPO at this time. She has 24 hour caregivers at home.  Na: 152, BUN/Creat:  41/1.38, Glucose: 115.   Nutrition Focused Physical Exam:  Subcutaneous Fat:  Orbital Region: mild depletion Upper Arm Region: WDL Thoracic and Lumbar Region: mild depletion  Muscle:  Temple Region: moderate depletion Clavicle Bone Region: mild depletion Clavicle and Acromion Bone Region: moderate depletion Scapular Bone Region: mild depletion Dorsal Hand: mild depletion Patellar Region: moderate depletion Anterior Thigh Region: mild depletion Posterior Calf Region: mild depletion  Edema: none present  Height: Ht Readings from Last 1 Encounters:  07/10/14  (1.626 m)    Weight: Wt Readings from Last 1 Encounters:  07/10/14 108 lb 11 oz (49.3 kg)    Ideal Body Weight: 120#  % Ideal Body Weight: 90%  Wt Readings from Last 10 Encounters:  07/10/14 108 lb 11 oz (49.3 kg)  04/18/09 102 lb (46.267 kg)    Usual Body Weight: 102#  % Usual Body Weight: 106%  BMI:  Body mass index is 18.65 kg/(m^2). Normal weight range  Estimated Nutritional Needs: Kcal: 1400-1600 Protein: 59-69 grams Fluid: 1.4-1.6 L  Skin: stage I pressure ulcer on sacrum  Diet Order: NPO  EDUCATION NEEDS: -Education not appropriate at this time  No intake or output data in the 24 hours ending 07/10/14 0855  Last BM: PTA  Labs:   Recent Labs Lab 07/06/14 0245 07/09/14 2200 07/10/14 0547  NA 143 148* 152*  K 4.2 4.0 4.0  CL 104 107 107  CO2 BUN 28* 42* 41*  CREATININE 1.24* 1.41* 1.38*  CALCIUM 8.2* 8.7 8.5  GLUCOSE 144* 158* 115*    CBG (last 3)  No results found for this basename: GLUCAP,  in the last 72 hours  Scheduled Meds: . antiseptic oral rinse  15 mL Mouth Rinse BID  . azithromycin  500 mg Intravenous Q24H  . cefTRIAXone (ROCEPHIN)  IV  1 g Intravenous Q24H  . furosemide  40 mg Intravenous Q12H  . heparin  5,000 Units Subcutaneous 3 times per day  . [START ON 07/11/2014] Influenza vac split quadrivalent PF  0.5 mL Intramuscular Tomorrow-1000  .  sodium chloride  3 mL Intravenous Q12H  . sodium chloride  3 mL Intravenous Q12H    Continuous Infusions:   Past Medical History  Diagnosis Date  . Alzheimer disease     Past Surgical History  Procedure Laterality Date  . Hip surgery Right     Patrisia Faeth A. Mayford Knife, RD, LDN Pager: [phone]

## 2014-07-10 NOTE — Progress Notes (Signed)
Patient moaning/groanong, possible pain. Patient unable to verbalize. Dr. Gwenlyn Perking notified.

## 2014-07-10 NOTE — Progress Notes (Signed)
Utilization review completed.  

## 2014-07-10 NOTE — Progress Notes (Signed)
Patient seen and examined. Admitted after midnight with SOB and findings on CXR consistent with pulmonary edema. Patient recently seen and diagnosed with presumed CHF; no echo on records or prior hx of CHF. Please referred to Dr. Alvester Morin H&P for further info/details on admission. Patient denies CP and appears to be comfortable now. She is no contributing much to history/examination due to underline dementia.  Plan: -after discussing with son, plan is to continue IV lasix and follow 2-D echo -we discussed regarding code status and he will talk with family members; in my opinion and given current quality, she will be a good candidate for hospice and symptomatic management -case discussed with cardiology and are in agreement with decision and current treatment -not a candidate for cath or invasive intervention -continue PRN oxygen supplementation -will add pulmicort BID and PRN xopenex for wheezing -patient currently back and forth between a. Fib; most likely due to CHF. Will control rate and continue tele for now.   Vassie Loll [phone]

## 2014-07-11 DIAGNOSIS — I359 Nonrheumatic aortic valve disorder, unspecified: Secondary | ICD-10-CM

## 2014-07-11 DIAGNOSIS — D72829 Elevated white blood cell count, unspecified: Secondary | ICD-10-CM

## 2014-07-11 DIAGNOSIS — R0989 Other specified symptoms and signs involving the circulatory and respiratory systems: Secondary | ICD-10-CM

## 2014-07-11 DIAGNOSIS — N179 Acute kidney failure, unspecified: Secondary | ICD-10-CM

## 2014-07-11 DIAGNOSIS — I5043 Acute on chronic combined systolic (congestive) and diastolic (congestive) heart failure: Secondary | ICD-10-CM | POA: Diagnosis not present

## 2014-07-11 DIAGNOSIS — E440 Moderate protein-calorie malnutrition: Secondary | ICD-10-CM

## 2014-07-11 DIAGNOSIS — R0609 Other forms of dyspnea: Secondary | ICD-10-CM

## 2014-07-11 LAB — BASIC METABOLIC PANEL
Anion gap: 14 (ref 5–15)
BUN: 33 mg/dL — AB (ref 6–23)
CALCIUM: 8.5 mg/dL (ref 8.4–10.5)
CHLORIDE: 107 meq/L (ref 96–112)
CO2: 32 meq/L (ref 19–32)
Creatinine, Ser: 1.28 mg/dL — ABNORMAL HIGH (ref 0.50–1.10)
GFR calc Af Amer: 40 mL/min — ABNORMAL LOW (ref >90)
GFR calc non Af Amer: 34 mL/min — ABNORMAL LOW (ref >90)
Glucose, Bld: 118 mg/dL — ABNORMAL HIGH (ref 70–99)
Potassium: 3 mEq/L — ABNORMAL LOW (ref 3.7–5.3)
Sodium: 153 mEq/L — ABNORMAL HIGH (ref 137–147)

## 2014-07-11 LAB — MAGNESIUM: Magnesium: 2.7 mg/dL — ABNORMAL HIGH (ref 1.5–2.5)

## 2014-07-11 MED ORDER — CARVEDILOL 3.125 MG PO TABS
3.1250 mg | ORAL_TABLET | Freq: Two times a day (BID) | ORAL | Status: DC
  Administered 2014-07-12 (×2): 3.125 mg via ORAL
  Filled 2014-07-11 (×2): qty 1

## 2014-07-11 MED ORDER — NEPRO/CARBSTEADY PO LIQD
237.0000 mL | Freq: Two times a day (BID) | ORAL | Status: DC
  Administered 2014-07-12: 237 mL via ORAL

## 2014-07-11 NOTE — Progress Notes (Signed)
Attempted mouth care on pt twice today.  Moisture applied to lips, but pt clamped mouth shut when mouth care attempted.  Pt refused dinner of mash potato by clamping mouth shut.  Son states that his mother was like this at home also.  Will continue to monitor.  Bed in lowest position.  Bed alarm on.

## 2014-07-11 NOTE — Progress Notes (Signed)
[facility] PROGRESS NOTE Interim History: 80 y/o admitted with SOB and orthopnea. Found to have acute exacerbation of CHF. 2-D echo revealing EF 15% and diffuse hypokinesis. Also with akinesis on the infero/anterior aspect. Poor prognosis and plan is for conservative management.   Filed Weights   07/09/14 2011 07/10/14 0054  Weight: 47.628 kg (105 lb) 49.3 kg (108 lb 11 oz)        Intake/Output Summary (Last 24 hours) at 07/11/14 1646 Last data filed at 07/11/14 1449  Gross per 24 hour  Intake     50 ml  Output   2100 ml  Net  -2050 ml     Assessment/Plan:   Presumed Bronchitis, UTI and leukocytosis: will continue rocephin for now. But patient is not febrile. SOB due to CHF.  -Will switch and adjust antibiotics if indicated tomorrow. -repeat CXR -urine cx pending    Malnutrition of moderate degree: started on Nepro BID    Dyspnea: due to CHF exacerbation. 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 -continue IV lasix X 1 more day -follow electrolytes and renal function -will add low dose B-blocker -no ACE given renal failure -prognosis is poor and family understands; would like to take patient home with hospice once stable.    AKI (acute kidney injury): due to decrease perfusion with HF exacerbation. -improving with diuresis -will monitor.    Hypokalemia: due to diuretics -will replete -Mg WNL   hypernatremia: due to intravascular depletion most likely. -will advance diet and follow electrolytes -patient mentation at baseline now and breathing easier; which would allow her to eat    Pressure ulcers: affecting patient heels. Stage 1 -will use prevalon boots -prevention measures.     Code Status: DNR Family Communication: son at bedside Disposition Plan: will stabilize and arrange for hospice at home. Breathing better and easier. Home in 1-2  days   Consultants:  Cardiology curbside (recommending conservative management and hospice)  Procedures: ECHO: 07/11/14 Impressions:  - Mild LVH with LVEF approximately 15-20% as outlined above, grade 1 diastolic dysfunction. Septal dyssynergy noted. Mild to moderate left atrial enlargement. MAC with mild mitral regurgitation. Sclerotic aortic valve with reduced cusp excursion, suspect related to low cardiac output. Mild aortic regurgitation. Severe RV dysfunction. Unable to assess PASP. Left pleural effusion.  Antibiotics:  Rocephin   HPI/Subjective: Improvement base on oxygen requirement; still SOB and tachypneic when trying to talk. EF 15% on echo done. Overall poor prognosis and not a candidate for invasive intervention.  Objective: Filed Vitals:   07/11/14 0728 07/11/14 0740 07/11/14 1438 07/11/14 1449  BP:  128/52  131/58  Pulse:  77  87  Temp:  98.5 F (36.9 C)  97.6 F (36.4 C)  TempSrc:  Axillary  Axillary  Resp:  20  18  Height:      Weight:      SpO2: 96% 98% 94% 92%     Exam:  General: awake, unable to answer questions given underlying dementia; according to family members she is at baseline. Chronically ill, poor nourished. No distress, no longer requiring venturi mask to maintain O2 sat  HEENT: No bruits, no goiter; no JVD  Heart: Regular rate; no rubs or gallops; positive SEM.  Lungs: decreased BS at bases, scattered rhonchi Abdomen: Soft, nontender, nondistended, positive bowel sounds.  Neuro: Grossly intact, nonfocal. Extremities: with stage 1 pressure ulcers affecting her heels; no open wounds or drainage.  Data Reviewed: Basic Metabolic Panel:  Recent Labs Lab 07/06/14 0245 07/09/14 2200 07/10/14 0547 07/11/14 0547  NA 143 148* 152* 153*  K 4.2 4.0 4.0 3.0*  CL 104 107 107 107  CO2 32  GLUCOSE 144* 158* 115* 118*  BUN 28* 42* 41* 33*  CREATININE 1.24* 1.41* 1.38* 1.28*  CALCIUM 8.2* 8.7 8.5 8.5  MG  --   --   --  2.7*    Liver Function Tests:  Recent Labs Lab 07/09/14 2200 07/10/14 0547  AST 31 30  ALT 45* 41*  ALKPHOS 109 107  BILITOT 0.4 0.6  PROT 7.6 7.0  ALBUMIN 2.6* 2.5*   CBC:  Recent Labs Lab 07/06/14 0245 07/09/14 2200 07/10/14 0547  WBC 10.4 13.5* 15.9*  NEUTROABS 9.0* 12.2* 13.1*  HGB 9.8* 11.4* 11.4*  HCT 30.9* 35.5* 36.1  MCV 69.1* 69.2* 69.4*  PLT 265 PLATELET CLUMPS NOTED ON SMEAR, COUNT APPEARS ADEQUATE 247   Cardiac Enzymes:  Recent Labs Lab 07/06/14 0245 07/09/14 2200 07/10/14 0009 07/10/14 0548 07/10/14 1145  TROPONINI <0.30 <0.30 <0.30 <0.30 <0.30   BNP (last 3 results)  Recent Labs  07/06/14 0245 07/09/14 2200  PROBNP 41304.0* >70000.0*     Recent Results (from the past 240 hour(s))  CULTURE, BLOOD (ROUTINE X 2)     Status: None   Collection Time    07/10/14  7:24 AM      Result Value Ref Range Status   Specimen Description BLOOD RIGHT HAND   Final   Special Requests BOTTLES DRAWN AEROBIC ONLY 10CC   Final   Culture NO GROWTH 1 DAY   Final   Report Status PENDING   Incomplete  CULTURE, BLOOD (ROUTINE X 2)     Status: None   Collection Time    07/10/14  7:44 AM      Result Value Ref Range Status   Specimen Description BLOOD RIGHT HAND   Final   Special Requests BOTTLES DRAWN AEROBIC ONLY 4CC   Final   Culture NO GROWTH 1 DAY   Final   Report Status PENDING   Incomplete     Studies: Dg Chest Portable 1 View  07/09/2014   CLINICAL DATA:  Shortness of breath for several days.  EXAM: PORTABLE CHEST - 1 VIEW  COMPARISON:  07/06/2014  FINDINGS: Cardiac enlargement. Pulmonary vascular congestion. Increasing diffuse bilateral airspace and interstitial infiltration likely representing edema. Small bilateral pleural effusions.  IMPRESSION: Cardiac enlargement, pulmonary vascular congestion, bilateral pulmonary infiltrates/edema, and bilateral pleural effusions. Mild progression since previous study.   Electronically Signed   By: Burman Nieves M.D.    On: 07/09/2014 21:11    Scheduled Meds: . antiseptic oral rinse  15 mL Mouth Rinse BID  . azithromycin  500 mg Intravenous Q24H  . budesonide (PULMICORT) nebulizer solution  0.25 mg Nebulization BID  . cefTRIAXone (ROCEPHIN)  IV  1 g Intravenous Q24H  . furosemide  40 mg Intravenous Q12H  . heparin  5,000 Units Subcutaneous 3 times per day  . sodium chloride  3 mL Intravenous Q12H  . sodium chloride  3 mL Intravenous Q12H   Continuous Infusions:    Vassie Loll  [facility] Pager [phone]. If 8PM-8AM, please contact night-coverage at [email or web address], password [facility] 07/11/2014, 4:46 PM  LOS: 2 days     **Disclaimer: This note may have been dictated with voice recognition software. Similar sounding words can inadvertently be transcribed and this note may contain transcription errors which may not  have been corrected upon publication of note.**

## 2014-07-11 NOTE — Progress Notes (Signed)
  Echocardiogram 2D Echocardiogram has been performed.  Geovanie Winnett 07/11/2014, 2:59 PM

## 2014-07-11 NOTE — Clinical Documentation Improvement (Addendum)
"  Stage 1 Sacral Pressure Ulcer documented by Nursing and Registered Dietician.  Please document if you agree with the assessment of a Stage 1 sacral pressure ulcer, including whether or not Present on Admission.   Thank You, Jerral Ralph ,RN Clinical Documentation Specialist:  [phone] [place]- [facility] Information Management  Stage 1 pressure ulcers affecting heels; will be added to progress notes and discharge summary. Thanks  Vassie Loll [phone]

## 2014-07-12 ENCOUNTER — Inpatient Hospital Stay (HOSPITAL_COMMUNITY): Payer: Medicare Other

## 2014-07-12 DIAGNOSIS — L89609 Pressure ulcer of unspecified heel, unspecified stage: Secondary | ICD-10-CM

## 2014-07-12 DIAGNOSIS — L8991 Pressure ulcer of unspecified site, stage 1: Secondary | ICD-10-CM

## 2014-07-12 LAB — URINE CULTURE
CULTURE: NO GROWTH
Colony Count: NO GROWTH

## 2014-07-12 LAB — BASIC METABOLIC PANEL
Anion gap: 14 (ref 5–15)
BUN: 33 mg/dL — AB (ref 6–23)
CHLORIDE: 108 meq/L (ref 96–112)
CO2: 34 mEq/L — ABNORMAL HIGH (ref 19–32)
CREATININE: 1.45 mg/dL — AB (ref 0.50–1.10)
Calcium: 8.5 mg/dL (ref 8.4–10.5)
GFR calc Af Amer: 34 mL/min — ABNORMAL LOW (ref >90)
GFR calc non Af Amer: 30 mL/min — ABNORMAL LOW (ref >90)
Glucose, Bld: 136 mg/dL — ABNORMAL HIGH (ref 70–99)
POTASSIUM: 2.8 meq/L — AB (ref 3.7–5.3)
Sodium: 156 mEq/L — ABNORMAL HIGH (ref 137–147)

## 2014-07-12 MED ORDER — FUROSEMIDE 10 MG/ML IJ SOLN: 20.0000 mg | Freq: Every day | INTRAMUSCULAR | Status: AC

## 2014-07-12 MED ORDER — POTASSIUM CHLORIDE 20 MEQ/15ML (10%) PO LIQD: 40.0000 meq | Freq: Every day | ORAL | Status: AC

## 2014-07-12 MED ORDER — LEVALBUTEROL HCL 1.25 MG/0.5ML IN NEBU: 1.2500 mg | INHALATION_SOLUTION | Freq: Three times a day (TID) | RESPIRATORY_TRACT | Status: AC | PRN

## 2014-07-12 MED ORDER — BUDESONIDE 0.25 MG/2ML IN SUSP: 0.2500 mg | Freq: Two times a day (BID) | RESPIRATORY_TRACT | Status: AC

## 2014-07-12 MED ORDER — MORPHINE SULFATE 2 MG/ML IJ SOLN: 0.5000 mg | INTRAMUSCULAR | Status: AC | PRN

## 2014-07-12 MED ORDER — FUROSEMIDE 10 MG/ML IJ SOLN: 40.0000 mg | Freq: Every day | INTRAMUSCULAR | Status: DC

## 2014-07-12 MED ORDER — LORAZEPAM 2 MG/ML IJ SOLN: 0.5000 mg | INTRAMUSCULAR | Status: AC | PRN

## 2014-07-12 MED ORDER — NEPRO/CARBSTEADY PO LIQD: 237.0000 mL | Freq: Two times a day (BID) | ORAL | Status: AC

## 2014-07-12 MED ORDER — POTASSIUM CHLORIDE 20 MEQ/15ML (10%) PO LIQD
40.0000 meq | Freq: Every day | ORAL | Status: DC
  Administered 2014-07-12: 40 meq via ORAL
  Filled 2014-07-12 (×2): qty 30

## 2014-07-12 NOTE — Progress Notes (Signed)
NUTRITION FOLLOW UP  Intervention:   Continue with current plan of care  Nutrition Dx:   Inadequate oral intake related to inability to eat as evidenced by NPO.   Goal:   Pt will meet >90% of estimated nutrition needs  Monitor:   PO/supplement intake, labs, weight changes, I/O's  Assessment:   This is a 80 y.o. year old female with significant past medical history of dementia, anemia presenting with dyspnea, CHF exacerbation. Pt's son at the bedisde. Primary historian. Reports pt with increased WOB over past 2 days as well as increased orthopnea and PND. States that pt was seen in ER for similar sxs 4-5 days ago. Was d//c'd on diuretic regimen. Has caregivers at home. Reports compliance with medication regimen. Also with midly worsening confusion. Son denies any reports of CP, nausea, vomiting, diarrhea. Has poor appetite at baseline.  Afebrile on presentation. Hemodynanically stable. HR variable from 20s-100s. Mainly in upper 90s. BP 110s-140s. Satting >93 % on RA. Trop neg x 1. Pro BNP > 70K. hgb 11.4, Cr 1.4. EKG w/ sinus rhythm, aberrant ST and T wave changes. CXR w/ cardiac enlargement and pulm vascular congestion mildly worsened since last study.   Pt has been advanced to a dysphagia 2 diet. Intake remains poor (PO: 0%). Per RN, pt is pursing lips shut when attempting to be fed. She refused dinner last night.  Noted order for Nepro BID by MD. Will continue to follow for acceptance of this supplement- agree with continuance due to high calorie and protein density.  Labs reviewed. Na: 156, K: 2.8 (on supplement),  BUN/Creat: 33/1.45, Glucose: 136.  Pt with extremely poor prognosis. Family will take home with hospice once medically stable for discharge.   Height: Ht Readings from Last 1 Encounters:  07/10/14  (1.626 m)    Weight Status:   Wt Readings from Last 1 Encounters:  07/10/14 108 lb 11 oz (49.3 kg)    Re-estimated needs:  Kcal: 1400-1600  Protein: 59-69 grams   Fluid: 1.4-1.6 L  Skin: stage I pressure ulcer on sacrum  Diet Order: Dysphagia 2   Intake/Output Summary (Last 24 hours) at 07/12/14 1127 Last data filed at 07/12/14 0800  Gross per 24 hour  Intake    420 ml  Output   1450 ml  Net  -1030 ml    Last BM: PTA   Labs:   Recent Labs Lab 07/10/14 0547 07/11/14 0547 07/12/14 0615  NA 152* 153* 156*  K 4.0 3.0* 2.8*  CL 107 107 108  CO2 28 32 34*  BUN 41* 33* 33*  CREATININE 1.38* 1.28* 1.45*  CALCIUM 8.5 8.5 8.5  MG  --  2.7*  --   GLUCOSE 115* 118* 136*    CBG (last 3)  No results found for this basename: GLUCAP,  in the last 72 hours  Scheduled Meds: . antiseptic oral rinse  15 mL Mouth Rinse BID  . azithromycin  500 mg Intravenous Q24H  . budesonide (PULMICORT) nebulizer solution  0.25 mg Nebulization BID  . carvedilol  3.125 mg Oral BID WC  . cefTRIAXone (ROCEPHIN)  IV  1 g Intravenous Q24H  . feeding supplement (NEPRO CARB STEADY)  237 mL Oral BID BM  . furosemide  40 mg Intravenous Q12H  . heparin  5,000 Units Subcutaneous 3 times per day  . potassium chloride  40 mEq Oral Daily  . sodium chloride  3 mL Intravenous Q12H  . sodium chloride  3 mL Intravenous Q12H  Continuous Infusions:   Deasiah Hagberg A. Mayford Knife, RD, LDN Pager: [phone]

## 2014-07-12 NOTE — Progress Notes (Signed)
DISCHARGE INSTRUCTIONS REVIEWED W/ PT'S CHILDREN. THEY ARE WAITING FOR HOSPICE NURSE TO COME AND TALK TO THEM.

## 2014-07-12 NOTE — Discharge Summary (Signed)
Physician Discharge Summary  [name] EPP:[id number] DOB: Apr 10, 1917 DOA: 07/09/2014  PCP: Cassell Smiles., MD  Admit date: 07/09/2014 Discharge date: 07/12/2014  Time spent: >30 minutes  Recommendations for Outpatient Follow-up:  Full comfort care Discharge with IV line to facilitate medications administration as patient unable to tolerate PO's  Discharge Diagnoses:  Active Problems:   Dyspnea   CHF exacerbation   AKI (acute kidney injury)   Leukocytosis   Malnutrition of moderate degree   Discharge Condition: will discharge to inpatient hospice for comfort Care.  Diet recommendation: comfort feeding  Filed Weights   07/09/14 2011 07/10/14 0054  Weight: 47.628 kg (105 lb) 49.3 kg (108 lb 11 oz)    History of present illness:  80 y.o. year old female with significant past medical history of dementia; presenting with dyspnea due to CHF exacerbation. Primary historian, patient's son, as due to dementia patient unable to contribute with hx. Son reported pt with increased WOB over past 2 days prior to admission; as well as increased orthopnea and PND. States that pt was seen in ER for similar sxs 4-5 days ago. Was d//c'd on diuretic regimen. Has caregivers at home. Reported difficulty with medication regimen compliance as patient unable to take pills properly. Also with midly worsening confusion. Son denies any reports of CP, nausea, vomiting, diarrhea. Has poor appetite at baseline.  Afebrile on presentation. Hemodynanically stable; Trop neg x 1. Pro BNP > 70K. EKG w/ sinus rhythm, aberrant ST and T wave changes. CXR w/ cardiac enlargement and pulm vascular congestion mildly worsened since last study.    Hospital Course:  Presumed Bronchitis, UTI and leukocytosis: received 3 days of Rocephin. She is afebrile. No signs or complaints that will suggest infections. -Will discontinue antibiotics -comfort care -given the fact she can not swallow pills; will arrange for inpatient  hospice and continue meds for comfort and symptoms palliation through her IV (lasix, morphine and ativan)   Malnutrition of moderate degree: comfort feeding. Continue Nepro BID if able to tolerate it.  Dyspnea: due to CHF exacerbation. Treatment as mentioned below  -continue oxygen supplementation  -comfort care  Acute on chronic systolic and diastolic CHF exacerbation: EF 60%.  -continue IV lasix  daily  -plan is for comfort care; poor prognosis and unable to take PO's -will be transferred to hospice home -due to difficulty swallowing pills, will avoid b-blockers -no ACE given renal failure  -prognosis is poor and family understands; would like patient to received just comfort care.   AKI (acute kidney injury): due to decrease perfusion with HF exacerbation.  -improved with diuresis  -will now change lasix to  daily -comfort care   Hypokalemia: -will attempt repletion; due to diuresis  -Mg WNL   hypernatremia: due to intravascular depletion most likely.  -lasix has been adjusted; plan is for comfort care -patient not eating -she will follow comfort care  Pressure ulcers: affecting patient heels. Stage 1  -will use prevalon boots  -prevention measures. -comfort care   Procedures: ECHO: 07/11/14  Impressions:  - Mild LVH with LVEF approximately 15-20% as outlined above, grade 1 diastolic dysfunction. Septal dyssynergy noted. Mild to moderate left atrial enlargement. MAC with mild mitral regurgitation. Sclerotic aortic valve with reduced cusp excursion, suspect related to low cardiac output. Mild aortic regurgitation. Severe RV dysfunction. Unable to assess PASP. Left pleural effusion.  Consultations:  None   Discharge Exam: Filed Vitals:   07/12/14 1423  BP: 106/77  Pulse: 62  Temp: 98.3 F (36.8 C)  Resp: 18   General: awake, unable to answer questions given underlying dementia; according to family members she is at baseline. Chronically ill, poor  nourished. No distress, no longer requiring venturi mask to maintain O2 sat; but on [place]. HEENT: No bruits, no goiter; no JVD  Heart: Regular rate; no rubs or gallops; positive SEM.  Lungs: decreased BS at bases, scattered rhonchi; no frank crackles Abdomen: Soft, nontender, nondistended, positive bowel sounds.  Extremities: with stage 1 pressure ulcers affecting her heels; no open wounds or drainage.    Discharge Instructions You were cared for by a hospitalist during your hospital stay. If you have any questions about your discharge medications or the care you received while you were in the hospital after you are discharged, you can call the unit and asked to speak with the hospitalist on call if the hospitalist that took care of you is not available. Once you are discharged, your primary care physician will handle any further medical issues. Please note that NO REFILLS for any discharge medications will be authorized once you are discharged, as it is imperative that you return to your primary care physician (or establish a relationship with a primary care physician if you do not have one) for your aftercare needs so that they can reassess your need for medications and monitor your lab values.  Discharge Instructions   Discharge instructions    Complete by:  As directed   Comfort feeding Comfort care Will discharge with [place] oxygen and IV line in order to facilitate route for therapy, as patient unable to take PO's properly          Current Discharge Medication List    START taking these medications   Details  budesonide (PULMICORT) 0.25 MG/2ML nebulizer solution Take 2 mLs (0.25 mg total) by nebulization 2 (two) times daily. Qty: 60 mL, Refills: 12    furosemide (LASIX) 10 MG/ML injection Inject 2 mLs (20 mg total) into the vein daily.    levalbuterol (XOPENEX) 1.25 MG/0.5ML nebulizer solution Take 1.25 mg by nebulization every 8 (eight) hours as needed for wheezing or shortness of  breath.    LORazepam (ATIVAN) 2 MG/ML injection Inject 0.25-0.5 mLs (0.5-1 mg total) into the vein every 4 (four) hours as needed for anxiety (SOB).    morphine 2 MG/ML injection Inject 0.25-0.5 mLs (0.5-1 mg total) into the vein every 2 (two) hours as needed (SOB and PAin. Comfort).    Nutritional Supplements (FEEDING SUPPLEMENT, NEPRO CARB STEADY,) LIQD Take 237 mLs by mouth 2 (two) times daily between meals. Refills: 0    potassium chloride 20 MEQ/15ML (10%) solution Take 30 mLs (40 mEq total) by mouth daily.      STOP taking these medications     calcium carbonate (ANTACID) 500 MG chewable tablet      donepezil (ARICEPT) 10 MG tablet      furosemide (LASIX) 40 MG tablet      levothyroxine (SYNTHROID, LEVOTHROID) 50 MCG tablet      magnesium oxide (MAG-OX) 400 MG tablet      NAMENDA XR 14 MG CP24        No Known Allergies    The results of significant diagnostics from this hospitalization (including imaging, microbiology, ancillary and laboratory) are listed below for reference.    Significant Diagnostic Studies: Dg Chest Port 1 View  07/12/2014   CLINICAL DATA:  Pleural effusion  EXAM: PORTABLE CHEST - 1 VIEW  COMPARISON:  July 09, 2014  FINDINGS: There is  cardiomegaly with fairly diffuse interstitial edema and bilateral effusions consistent with congestive heart failure. There appears to be slightly less alveolar opacity in the left mid lung compared to recent prior study. Currently there is no appreciable airspace consolidation. No adenopathy. The pulmonary vascularity is within normal limits. There is atherosclerotic change in aorta. Bones are osteoporotic.  IMPRESSION: Congestive heart failure.  No airspace consolidation.   Electronically Signed   By: Bretta Bang M.D.   On: 07/12/2014 09:20   Dg Chest Portable 1 View  07/09/2014   CLINICAL DATA:  Shortness of breath for several days.  EXAM: PORTABLE CHEST - 1 VIEW  COMPARISON:  07/06/2014  FINDINGS: Cardiac  enlargement. Pulmonary vascular congestion. Increasing diffuse bilateral airspace and interstitial infiltration likely representing edema. Small bilateral pleural effusions.  IMPRESSION: Cardiac enlargement, pulmonary vascular congestion, bilateral pulmonary infiltrates/edema, and bilateral pleural effusions. Mild progression since previous study.   Electronically Signed   By: Burman Nieves M.D.   On: 07/09/2014 21:11   Dg Chest Port 1 View  07/06/2014   CLINICAL DATA:  Dyspnea  EXAM: PORTABLE CHEST - 1 VIEW  COMPARISON:  03/22/2011  FINDINGS: Cardiac enlargement with interval development of perihilar airspace disease likely representing edema. Bilateral pleural effusions greater on the right. No pneumothorax. Calcified aorta.  IMPRESSION: Cardiac enlargement with interval development of perihilar airspace disease, likely edema, and bilateral pleural effusions.   Electronically Signed   By: Burman Nieves M.D.   On: 07/06/2014 02:54    Microbiology: Recent Results (from the past 240 hour(s))  URINE CULTURE     Status: None   Collection Time    07/10/14  6:23 AM      Result Value Ref Range Status   Specimen Description URINE, CLEAN CATCH   Final   Special Requests NONE   Final   Culture  Setup Time     Final   Value: 07/10/2014 11:07     Performed at [facility] Count     Final   Value: NO GROWTH     Performed at [facility]   Culture     Final   Value: NO GROWTH     Performed at [facility]   Report Status 07/12/2014 FINAL   Final  CULTURE, BLOOD (ROUTINE X 2)     Status: None   Collection Time    07/10/14  7:24 AM      Result Value Ref Range Status   Specimen Description BLOOD RIGHT HAND   Final   Special Requests BOTTLES DRAWN AEROBIC ONLY 10CC   Final   Culture NO GROWTH 1 DAY   Final   Report Status PENDING   Incomplete  CULTURE, BLOOD (ROUTINE X 2)     Status: None   Collection Time    07/10/14  7:44 AM      Result Value Ref Range Status    Specimen Description BLOOD RIGHT HAND   Final   Special Requests BOTTLES DRAWN AEROBIC ONLY 4CC   Final   Culture NO GROWTH 1 DAY   Final   Report Status PENDING   Incomplete     Labs: Basic Metabolic Panel:  Recent Labs Lab 07/06/14 0245 07/09/14 2200 07/10/14 0547 07/11/14 0547 07/12/14 0615  NA 143 148* 152* 153* 156*  K 4.2 4.0 4.0 3.0* 2.8*  CL 104 107 107 107 108  CO2 32 34*  GLUCOSE 144* 158* 115* 118* 136*  BUN 28* 42*  41* 33* 33*  CREATININE 1.24* 1.41* 1.38* 1.28* 1.45*  CALCIUM 8.2* 8.7 8.5 8.5 8.5  MG  --   --   --  2.7*  --    Liver Function Tests:  Recent Labs Lab 07/09/14 2200 07/10/14 0547  AST 31 30  ALT 45* 41*  ALKPHOS 109 107  BILITOT 0.4 0.6  PROT 7.6 7.0  ALBUMIN 2.6* 2.5*   CBC:  Recent Labs Lab 07/06/14 0245 07/09/14 2200 07/10/14 0547  WBC 10.4 13.5* 15.9*  NEUTROABS 9.0* 12.2* 13.1*  HGB 9.8* 11.4* 11.4*  HCT 30.9* 35.5* 36.1  MCV 69.1* 69.2* 69.4*  PLT 265 PLATELET CLUMPS NOTED ON SMEAR, COUNT APPEARS ADEQUATE 247   Cardiac Enzymes:  Recent Labs Lab 07/06/14 0245 07/09/14 2200 07/10/14 0009 07/10/14 0548 07/10/14 1145  TROPONINI <0.30 <0.30 <0.30 <0.30 <0.30   BNP: BNP (last 3 results)  Recent Labs  07/06/14 0245 07/09/14 2200  PROBNP 41304.0* >70000.0*    Signed:  Vassie Loll  [facility] 07/12/2014, 3:59 PM

## 2014-07-12 NOTE — Progress Notes (Signed)
TRANSFER REPORT CALLED TO DEMETRIA RN @ [facility]. FAMILY WAITING AT BEDSIDE.

## 2014-07-12 NOTE — Progress Notes (Signed)
Critical value text paged to MD.  MD received paged and paged back daytime nurse.  Orders received.

## 2014-07-12 NOTE — Clinical Social Work Psychosocial (Signed)
    Clinical Social Work Department BRIEF PSYCHOSOCIAL ASSESSMENT 07/12/2014  Patient:  [name], [name]     Account Number:  [id number]     Admit date:  07/09/2014  Clinical Social Worker:  Santa Genera, CLINICAL SOCIAL WORKER  Date/Time:  07/12/2014 05:00 PM  Referred by:  Physician  Date Referred:  07/12/2014 Referred for  Residential hospice placement   Other Referral:   Interview type:  Family Other interview type:    PSYCHOSOCIAL DATA Living Status:  ALONE Admitted from facility:   Level of care:   Primary support name:  Candi Leash, Dannielle Huh Saur Primary support relationship to patient:  CHILD, ADULT Degree of support available:   Chlidren live in other states, patient has 24/7 caregivers at home    CURRENT CONCERNS Current Concerns  Post-Acute Placement   Other Concerns:    SOCIAL WORK ASSESSMENT / PLAN CSW unable to assess patient directly, patient asleep.  MD has discussed comfort care transition w family at bedside, family agreeable.  Patient now DNR.  Son and daughter at bedside live out of state ([place] and [place]).  Another sibling also lives out of state.  Chlidren visit as much as they are able.  Patient currently living at home with 24/7 caregivers which  family pays for.  Family understands that patient is transitioning to end of life care, hospice contacted and will meet with family today.  Per hospice, bed is available at residential facility.  Family briefed on hospice, agreeable to EMS transport to facility when patient is ready.  Family in room awaiting hospice staff.   Assessment/plan status:  Referral to [facility] Other assessment/ plan:   Information/referral to community resources:   Hospice Home    PATIENT'S/FAMILY'S RESPONSE TO PLAN OF CARE: Family appreciative of support in end of life care for patient/mother.        Santa Genera, LCSW Clinical Social Worker [phone])

## 2014-07-15 LAB — CULTURE, BLOOD (ROUTINE X 2)
Culture: NO GROWTH
Culture: NO GROWTH

## 2014-07-16 NOTE — Care Management Note (Signed)
    Page 1 of 1   07/16/2014     3:54:51 PM CARE MANAGEMENT NOTE 07/16/2014  Patient:  [name], [name]   Account Number:  [id number]  Date Initiated:  07/12/2014  Documentation initiated by:  Anibal Henderson  Subjective/Objective Assessment:   Admitted with CHF, AKI, dementia. pt is from home with family as 24h care. Pt is  unable to eat much or take pills, so pt is being made comfort care and referred to Hospice, and family preferrs Hospice Home, and MD agrees.     Action/Plan:   Referred to CSW- pt to trancsfer today   Anticipated DC Date:  07/12/2014   Anticipated DC Plan:  [facility]  In-house referral  Clinical Social Worker      DC Planning Services  CM consult      [facility] Choice  HOSPICE   Choice offered to / List presented to:  C-4 Adult Children           Status of service:  Completed, signed off Medicare Important Message given?  YES (If response is "NO", the following Medicare IM given date fields will be blank) Date Medicare IM given:  07/16/2014 Medicare IM given by:  Anibal Henderson Date Additional Medicare IM given:   Additional Medicare IM given by:    Discharge Disposition:  REST HOME  Per UR Regulation:  Reviewed for med. necessity/level of care/duration of stay  If discussed at Long Length of Stay Meetings, dates discussed:    Comments:  07/12/14 1600 Anibal Henderson RN/CM

## 2014-07-25 ENCOUNTER — Ambulatory Visit: Payer: Medicare Other | Admitting: Orthopedic Surgery

## 2014-08-01 DEATH — deceased
